# Patient Record
Sex: Male | Born: 1960
Health system: Southern US, Community
[De-identification: ages and names within clinical notes are randomized; demographics above are authoritative.]

## PROBLEM LIST (undated history)

## (undated) DIAGNOSIS — I10 Essential (primary) hypertension: Secondary | ICD-10-CM

## (undated) DIAGNOSIS — J069 Acute upper respiratory infection, unspecified: Secondary | ICD-10-CM

## (undated) DIAGNOSIS — I2510 Atherosclerotic heart disease of native coronary artery without angina pectoris: Secondary | ICD-10-CM

## (undated) DIAGNOSIS — J302 Other seasonal allergic rhinitis: Secondary | ICD-10-CM

## (undated) DIAGNOSIS — E785 Hyperlipidemia, unspecified: Secondary | ICD-10-CM

## (undated) HISTORY — DX: Essential (primary) hypertension: I10

## (undated) HISTORY — PX: LAPAROSCOPIC CHOLECYSTECTOMY: SUR755

## (undated) HISTORY — DX: Atherosclerotic heart disease of native coronary artery without angina pectoris: I25.10

## (undated) HISTORY — DX: Hyperlipidemia, unspecified: E78.5

## (undated) HISTORY — PX: APPENDECTOMY: SHX54

---

## 2009-10-01 ENCOUNTER — Encounter: Admission: RE | Admit: 2009-10-01 | Discharge: 2009-10-01 | Payer: Self-pay | Admitting: Family Medicine

## 2009-10-14 HISTORY — PX: CORONARY ANGIOPLASTY WITH STENT PLACEMENT: SHX49

## 2009-11-09 ENCOUNTER — Ambulatory Visit: Payer: Self-pay | Admitting: Cardiology

## 2009-11-09 ENCOUNTER — Inpatient Hospital Stay (HOSPITAL_COMMUNITY): Admission: EM | Admit: 2009-11-09 | Discharge: 2009-11-12 | Payer: Self-pay | Admitting: Emergency Medicine

## 2009-11-17 ENCOUNTER — Telehealth: Payer: Self-pay | Admitting: Cardiology

## 2009-11-27 DIAGNOSIS — E785 Hyperlipidemia, unspecified: Secondary | ICD-10-CM | POA: Insufficient documentation

## 2009-11-27 DIAGNOSIS — I10 Essential (primary) hypertension: Secondary | ICD-10-CM | POA: Insufficient documentation

## 2009-11-27 DIAGNOSIS — E119 Type 2 diabetes mellitus without complications: Secondary | ICD-10-CM | POA: Insufficient documentation

## 2009-12-01 ENCOUNTER — Ambulatory Visit: Payer: Self-pay | Admitting: Cardiology

## 2009-12-01 ENCOUNTER — Encounter (INDEPENDENT_AMBULATORY_CARE_PROVIDER_SITE_OTHER): Payer: Self-pay | Admitting: *Deleted

## 2009-12-01 DIAGNOSIS — I2589 Other forms of chronic ischemic heart disease: Secondary | ICD-10-CM | POA: Insufficient documentation

## 2009-12-01 DIAGNOSIS — I2510 Atherosclerotic heart disease of native coronary artery without angina pectoris: Secondary | ICD-10-CM | POA: Insufficient documentation

## 2009-12-18 ENCOUNTER — Encounter: Payer: Self-pay | Admitting: Cardiology

## 2009-12-19 ENCOUNTER — Encounter: Payer: Self-pay | Admitting: Cardiology

## 2009-12-19 ENCOUNTER — Ambulatory Visit: Payer: Self-pay | Admitting: Cardiology

## 2009-12-19 ENCOUNTER — Ambulatory Visit: Payer: Self-pay

## 2009-12-19 ENCOUNTER — Ambulatory Visit (HOSPITAL_COMMUNITY): Admission: RE | Admit: 2009-12-19 | Discharge: 2009-12-19 | Payer: Self-pay | Admitting: Cardiology

## 2009-12-24 ENCOUNTER — Telehealth: Payer: Self-pay | Admitting: Cardiology

## 2009-12-24 ENCOUNTER — Telehealth (INDEPENDENT_AMBULATORY_CARE_PROVIDER_SITE_OTHER): Payer: Self-pay | Admitting: *Deleted

## 2009-12-24 LAB — CONVERTED CEMR LAB
Alkaline Phosphatase: 37 units/L — ABNORMAL LOW (ref 39–117)
Basophils Absolute: 0 10*3/uL (ref 0.0–0.1)
Bilirubin, Direct: 0 mg/dL (ref 0.0–0.3)
Eosinophils Absolute: 0.1 10*3/uL (ref 0.0–0.7)
HCT: 39.7 % (ref 39.0–52.0)
Hemoglobin: 13.4 g/dL (ref 13.0–17.0)
LDL Cholesterol: 51 mg/dL (ref 0–99)
Lymphs Abs: 1.5 10*3/uL (ref 0.7–4.0)
MCHC: 33.8 g/dL (ref 30.0–36.0)
Monocytes Relative: 9.2 % (ref 3.0–12.0)
Neutro Abs: 2.4 10*3/uL (ref 1.4–7.7)
RDW: 14 % (ref 11.5–14.6)
Total CHOL/HDL Ratio: 3

## 2010-01-05 ENCOUNTER — Encounter: Payer: Self-pay | Admitting: Cardiology

## 2010-01-06 ENCOUNTER — Ambulatory Visit: Payer: Self-pay | Admitting: Cardiology

## 2010-02-05 ENCOUNTER — Telehealth: Payer: Self-pay | Admitting: Cardiology

## 2010-05-17 ENCOUNTER — Emergency Department (HOSPITAL_COMMUNITY): Admission: EM | Admit: 2010-05-17 | Discharge: 2010-05-17 | Payer: Self-pay | Admitting: Emergency Medicine

## 2010-06-01 ENCOUNTER — Ambulatory Visit: Payer: Self-pay | Admitting: Cardiology

## 2010-09-15 NOTE — Assessment & Plan Note (Signed)
Summary: EPH   Visit Type:  Follow-up Primary Provider:  DR [name]  CC:  no complaints.  History of Present Illness: The patient is 50 years old and returns for management of CAD after his recent anterior MI. He was hospitalized on November 09, 2009 with an anterior wall MI treated with a bare-metal stent to the LAD. His ejection fraction at catheterization was 40%. This was a little surprising since his reperfusion was lately been having chest pain for a few days.  He's done well since discharge with no chest pain shortness of breath or palpitations. He says his walking about 45 minutes a day and is scheduled to return to work later this week. He works at [employer] as a Nature conservation officer.  His other problems include hypertension, hyperlipidemia, and diet-controlled diabetes.  Current Medications (verified): 1)  Aspirin Ec 325 Mg Tbec (Aspirin) .... Take One Tablet By Mouth Daily 2)  Coreg 12.5 Mg Tabs (Carvedilol) .Marland Kitchen.. 1 Tab Bid 3)  Nitrostat 0.4 Mg Subl (Nitroglycerin) .Marland Kitchen.. 1 Tablet Under Tongue At Onset of Chest Pain; You May Repeat Every 5 Minutes For Up To 3 Doses. 4)  Effient 10 Mg Tabs (Prasugrel Hcl) .Marland Kitchen.. 1 Tab Once Daily 5)  Crestor 20 Mg Tabs (Rosuvastatin Calcium) .Marland Kitchen.. 1 Tab Once Daily 6)  Fish Oil .... 2 Tabs in The Am 1 Tab in The Pm 7)  Flonase 50 Mcg/act Susp (Fluticasone Propionate) .Marland Kitchen.. 1 Spray in Each Nostril As Needed 8)  Lisinopril-Hydrochlorothiazide 20-12.5 Mg Tabs (Lisinopril-Hydrochlorothiazide) .Marland Kitchen.. 1 Tab Qd 9)  Tricor 145 Mg Tabs (Fenofibrate) .Marland Kitchen.. 1 Tab Once Daily  Allergies (verified): No Known Drug Allergies  Past History:  Past Medical History: Reviewed history from 11/27/2009 and no changes required. Diabetes Controlled Hyperlipidemia Hypertension  Current Problems:  DIABETES MELLITUS, CONTROLLED (ICD-250.00) HYPERTENSION (ICD-401.9) HYPERLIPIDEMIA (ICD-272.4)  Review of Systems       ROS is negative except as outlined in HPI.   Vital  Signs:  Patient profile:   50 year old male Height:      70 inches Weight:      281 pounds BMI:     40.47 Pulse rate:   74 / minute BP sitting:   113 / 76  (left arm) Cuff size:   large  Vitals Entered By: Burnett Kanaris, CNA (December 01, 2009 11:53 AM)  Physical Exam  Additional Exam:  Gen. Well-nourished, in no distress   Neck: No JVD, thyroid not enlarged, no carotid bruits Lungs: No tachypnea, clear without rales, rhonchi or wheezes Cardiovascular: Rhythm regular, PMI not displaced,  heart sounds  normal, no murmurs or gallops, no peripheral edema, pulses normal in all 4 extremities. Abdomen: BS normal, abdomen soft and non-tender without masses or organomegaly, no hepatosplenomegaly. MS: No deformities, no cyanosis or clubbing   Neuro:  No focal sns   Skin:  no lesions    Impression & Recommendations:  Problem # 1:  CAD, NATIVE VESSEL (ICD-414.01)  He is now 3 weeks status post an anterior wall MI treated with a bare-metal stent to the LAD. His ejection fraction is 40%. He does not have significant residual disease in the circumflex and right coronary arteries. He has done well and has had no recurrent chest pain. This problem appears stable. His updated medication list for this problem includes:    Aspirin Ec 325 Mg Tbec (Aspirin) .Marland Kitchen... Take one tablet by mouth daily    Coreg 12.5 Mg Tabs (Carvedilol) .Marland Kitchen... Take one and 1/2 tabs two times  a day    Nitrostat 0.4 Mg Subl (Nitroglycerin) .Marland Kitchen... 1 tablet under tongue at onset of chest pain; you may repeat every 5 minutes for up to 3 doses.    Effient 10 Mg Tabs (Prasugrel hcl) .Marland Kitchen... 1 tab once daily    Lisinopril-hydrochlorothiazide 20-12.5 Mg Tabs (Lisinopril-hydrochlorothiazide) .Marland Kitchen... 1 tab qd  Problem # 2:  CARDIOMYOPATHY, ISCHEMIC (ICD-414.8)  He has an ischemic cardiomyopathy with an ejection fraction of 40%. We will increase his Coreg from 12.5 b.i.d. to one and a half tablets b.i.d. We will arrange a followup echocardiogram  in 4 weeks at the time of his followup office visit. His updated medication list for this problem includes:    Aspirin Ec 325 Mg Tbec (Aspirin) .Marland Kitchen... Take one tablet by mouth daily    Coreg 12.5 Mg Tabs (Carvedilol) .Marland Kitchen... Take one and 1/2 tabs two times a day    Nitrostat 0.4 Mg Subl (Nitroglycerin) .Marland Kitchen... 1 tablet under tongue at onset of chest pain; you may repeat every 5 minutes for up to 3 doses.    Effient 10 Mg Tabs (Prasugrel hcl) .Marland Kitchen... 1 tab once daily    Lisinopril-hydrochlorothiazide 20-12.5 Mg Tabs (Lisinopril-hydrochlorothiazide) .Marland Kitchen... 1 tab qd  Problem # 3:  HYPERLIPIDEMIA (ICD-272.4)  Crestor was added to TriCor when he was in the hospital. We will get a lipid and liver profile in 4 weeks on his return office visit. His updated medication list for this problem includes:    Crestor 20 Mg Tabs (Rosuvastatin calcium) .Marland Kitchen... 1 tab once daily    Tricor 145 Mg Tabs (Fenofibrate) .Marland Kitchen... 1 tab once daily  Problem # 4:  HYPERTENSION (ICD-401.9)  This appears well controlled on current medications. His updated medication list for this problem includes:    Aspirin Ec 325 Mg Tbec (Aspirin) .Marland Kitchen... Take one tablet by mouth daily    Coreg 12.5 Mg Tabs (Carvedilol) .Marland Kitchen... Take one and 1/2 tabs two times a day    Lisinopril-hydrochlorothiazide 20-12.5 Mg Tabs (Lisinopril-hydrochlorothiazide) .Marland Kitchen... 1 tab qd  Other Orders: EKG w/ Interpretation (93000) Echocardiogram (Echo)  Patient Instructions: 1)  Your physician recommends that you schedule a follow-up appointment in: 4 weeks 2)  Your physician recommends that you return for FASTING lab work in 4 weeks: lipid/liver/cbc (410.02;272.2;402.10) 3)  Your physician has recommended you make the following change in your medication: 1) Increase coreg (carvediolol) to 12.5mg  one and 1/2 tablets two times a day  4)  Your physician has requested that you have an echocardiogram.  Echocardiography is a painless test that uses sound waves to create images  of your heart. It provides your doctor with information about the size and shape of your heart and how well your heart's chambers and valves are working.  This procedure takes approximately one hour. There are no restrictions for this procedure. Prescriptions: COREG 12.5 MG TABS (CARVEDILOL) take one and 1/2 tabs two times a day  #90 x 6   Entered by:   Sherri Rad, RN, BSN   Authorized by:   Lenoria Farrier, MD, [facility]   Signed by:   Sherri Rad, RN, BSN on 12/01/2009   Method used:   Electronically to        [facility] [phone]* (retail)       [address]       [place], [place]  [address]       Ph: [phone] or [phone]       Fax:  [phone]   RxID:   [id number]

## 2010-09-15 NOTE — Assessment & Plan Note (Signed)
Summary: 4 WKS   Visit Type:  Follow-up Primary Provider:  DR [name]  CC:  no complaints.  History of Present Illness: The patient is 50 years old and return for management of CAD. On November 09, 2009 he had an anterior MI treated with a bare-metal stent to the LAD. His ejection fraction was 40% but on his recent echo on 12/19/09 his ejection fraction had improved to 55 and 60%.  He has been doing quite well over the past several weeks. 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 He also is walking 30 minutes a day. He is lost about 5 pounds since his last visit and is on a program of gradual weight reduction.  His other problems include hypertension, hyperlipidemia, and diet-controlled diabetes. We recently switched him from Crestor to simvastatin for cost reasons. His lipid profile on Crestor was a total cholesterol 104, and HDL of 31, and LDL of 51, and a triglyceride of 107.  Current Medications (verified): 1)  Aspirin 81 Mg Tbec (Aspirin) .... Take One Tablet By Mouth Daily 2)  Coreg 12.5 Mg Tabs (Carvedilol) .... Take One and 1/2 Tabs Two Times A Day 3)  Nitrostat 0.4 Mg Subl (Nitroglycerin) .Marland Kitchen.. 1 Tablet Under Tongue At Onset of Chest Pain; You May Repeat Every 5 Minutes For Up To 3 Doses. 4)  Effient 10 Mg Tabs (Prasugrel Hcl) .Marland Kitchen.. 1 Tab Once Daily 5)  Simvastatin 40 Mg Tabs (Simvastatin) .... Take One Tablet By Mouth Daily At Bedtime 6)  Fish Oil .... 2 Tabs in The Am 1 Tab in The Pm 7)  Flonase 50 Mcg/act Susp (Fluticasone Propionate) .Marland Kitchen.. 1 Spray in Each Nostril As Needed 8)  Lisinopril-Hydrochlorothiazide 20-12.5 Mg Tabs (Lisinopril-Hydrochlorothiazide) .Marland Kitchen.. 1 Tab Qd 9)  Tricor 145 Mg Tabs (Fenofibrate) .Marland Kitchen.. 1 Tab Once Daily  Allergies (verified): No Known Drug Allergies  Past History:  Past Medical History: Reviewed history from 11/27/2009 and no changes required. Diabetes Controlled Hyperlipidemia Hypertension  Current Problems:  DIABETES MELLITUS,  CONTROLLED (ICD-250.00) HYPERTENSION (ICD-401.9) HYPERLIPIDEMIA (ICD-272.4)  Review of Systems       ROS is negative except as outlined in HPI.   Vital Signs:  Patient profile:   50 year old male Height:      70 inches Weight:      277 pounds Pulse rate:   60 / minute BP sitting:   113 / 67  (left arm)  Vitals Entered By: Burnett Kanaris, CNA (Jan 06, 2010 9:58 AM)  Physical Exam  Additional Exam:  Gen. Well-nourished, in no distress   Neck: No JVD, thyroid not enlarged, no carotid bruits Lungs: No tachypnea, clear without rales, rhonchi or wheezes Cardiovascular: Rhythm regular, PMI not displaced,  heart sounds  normal, no murmurs or gallops, no peripheral edema, pulses normal in all 4 extremities. Abdomen: BS normal, abdomen soft and non-tender without masses or organomegaly, no hepatosplenomegaly. MS: No deformities, no cyanosis or clubbing   Neuro:  No focal sns   Skin:  no lesions    Impression & Recommendations:  Problem # 1:  CAD, NATIVE VESSEL (ICD-414.01) He is now about 2 months status post a bare-metal stent to the LAD for an anterior MI. His ejection fraction has normalized. He is doing quite well and this problem is stable. His updated medication list for this problem includes:    Aspirin 81 Mg Tbec (Aspirin) .Marland Kitchen... Take one tablet by mouth daily    Coreg 12.5 Mg Tabs (Carvedilol) .Marland KitchenMarland KitchenMarland KitchenMarland Kitchen  Take one and 1/2 tabs two times a day    Nitrostat 0.4 Mg Subl (Nitroglycerin) .Marland Kitchen... 1 tablet under tongue at onset of chest pain; you may repeat every 5 minutes for up to 3 doses.    Effient 10 Mg Tabs (Prasugrel hcl) .Marland Kitchen... 1 tab once daily    Lisinopril-hydrochlorothiazide 20-12.5 Mg Tabs (Lisinopril-hydrochlorothiazide) .Marland Kitchen... 1 tab qd  Problem # 2:  HYPERLIPIDEMIA (ICD-272.4) His lipid profile on Crestor was good except for a low HDL of 31. We have now switched and the simvastatin for cost reasons. His triglycerides have been low and I think he can get by without the fetal  fibroid. We will stop the TriCor and continue the simvastatin he is scheduled for a followup lipid profile in 6 weeks. We also talked about low back carbohydrate diet to help with the low HDL and he is already doing this along with his diabetic and weight reduction diet. The following medications were removed from the medication list:    Tricor 145 Mg Tabs (Fenofibrate) .Marland Kitchen... 1 tab once daily His updated medication list for this problem includes:    Simvastatin 40 Mg Tabs (Simvastatin) .Marland Kitchen... Take one tablet by mouth daily at bedtime  Problem # 3:  HYPERTENSION (ICD-401.9)  This appears well controlled on current medications. His updated medication list for this problem includes:    Aspirin 81 Mg Tbec (Aspirin) .Marland Kitchen... Take one tablet by mouth daily    Coreg 12.5 Mg Tabs (Carvedilol) .Marland Kitchen... Take one and 1/2 tabs two times a day    Lisinopril-hydrochlorothiazide 20-12.5 Mg Tabs (Lisinopril-hydrochlorothiazide) .Marland Kitchen... 1 tab qd  His updated medication list for this problem includes:    Aspirin 81 Mg Tbec (Aspirin) .Marland Kitchen... Take one tablet by mouth daily    Coreg 12.5 Mg Tabs (Carvedilol) .Marland Kitchen... Take one and 1/2 tabs two times a day    Lisinopril-hydrochlorothiazide 20-12.5 Mg Tabs (Lisinopril-hydrochlorothiazide) .Marland Kitchen... 1 tab qd  Patient Instructions: 1)  Your physician has recommended you make the following change in your medication: 1) STOP Tricor. 2)  Your physician wants you to follow-up in: 6 months with Dr. Juanda Chance.  You will receive a reminder letter in the mail two months in advance. If you don't receive a letter, please call our office to schedule the follow-up appointment. 3)  FASTING labwork as scheduled around 02/17/10.

## 2010-09-15 NOTE — Progress Notes (Signed)
Summary: refill meds  Phone Note Refill Request Call back at Home Phone [phone] Message from:  Patient on Dec 24, 2009 12:04 PM  Refills Requested: Medication #1:  CRESTOR 20 MG TABS 1 tab once daily [facility] in [place]   Method Requested: Fax to Local Pharmacy Initial call taken by: Lorne Skeens,  Dec 24, 2009 12:04 PM  Follow-up for Phone Call        Rx faxed to pharmacy Follow-up by: Oswald Hillock,  Dec 24, 2009 2:14 PM    Prescriptions: CRESTOR 20 MG TABS (ROSUVASTATIN CALCIUM) 1 tab once daily  #30 x 6   Entered by:   Oswald Hillock   Authorized by:   Lenoria Farrier, MD, [facility]   Signed by:   Oswald Hillock on 12/24/2009   Method used:   Faxed to ...       [facility] [phone]* (retail)       [address]       [place], [place]  [address]       Ph: [phone] or [phone]       Fax: [phone]   RxID:   [id number]

## 2010-09-15 NOTE — Progress Notes (Signed)
Summary: Question about medication -Crestor   Phone Note Call from Patient Call back at Home Phone [phone]   Caller: Patient Summary of Call: Pt have questions about medication ( Rosubastatin 20mg ) Initial call taken by: Judie Grieve,  November 17, 2009 9:33 AM  Follow-up for Phone Call        left message for pt to return phone call@ 10:53am  Follow-up by: Burnett Kanaris, CNA,  November 17, 2009 10:54 AM  Additional Follow-up for Phone Call Additional follow up Details #1::        PT RETURNED CALL AND LET ME KNOW HE HAD NOT STARTED CRESTOR YET BECAUSE PHARMACY STATED THAT INSURANCE WOULD NOT COVER CRESTOR. I CALLED [facility] ON [address] IN [place].PHARAMIST STATED THAT INSURANCE COMPANY WOULD LIKE PT TO TRY A GENERIC BRAND LIKE SIMVASTATION FIRST-IF NOT PRIOR AUTHORZATION WOULD BE NEEDED. i GAVE PT SAMPLES OF CRESTOR TO HOLD HIM UNTIL HE COMES IN FOR HIS APPT IN [place] Additional Follow-up by: Burnett Kanaris, CNA,  November 17, 2009 11:57 AM    Additional Follow-up for Phone Call Additional follow up Details #2::    Noted- we will address with the pt at his office visit. Follow-up by: Sherri Rad, RN, BSN,  November 19, 2009 9:18 AM  .

## 2010-09-15 NOTE — Assessment & Plan Note (Signed)
Summary: eph   Visit Type:  Follow-up Primary Keeyon Privitera:  DR [name]  CC:  no complaints.  History of Present Illness: Mr. Scott Stokes is 50 years old and return for management of CAD. In March of 2011 he had anterior wall MI treated with a bare-metal stent to the LAD. His ejection fraction was 40% and improved to 60%.  He has done well since that time.  he [facility] for some upper expiratory symptoms and had some vague chest pain but this was thought to be musculoskeletal. All his studies that were negative. He's had no recurrence of symptoms and has had no shortness of breath or palpitations.    He had some potential side effects of simvastatin and is now on Crestor which is being provided by his primary care physician.  His other problems include hypertension and Diovan his diabetes.  Current Medications (verified): 1)  Aspirin 81 Mg Tbec (Aspirin) .... Take One Tablet By Mouth Daily 2)  Coreg 12.5 Mg Tabs (Carvedilol) .... Take One and 1/2 Tabs Two Times A Day 3)  Nitrostat 0.4 Mg Subl (Nitroglycerin) .Marland Kitchen.. 1 Tablet Under Tongue At Onset of Chest Pain; You May Repeat Every 5 Minutes For Up To 3 Doses. 4)  Effient 10 Mg Tabs (Prasugrel Hcl) .Marland Kitchen.. 1 Tab Once Daily 5)  Crestor 10 Mg Tabs (Rosuvastatin Calcium) .... Take One Tablet By Mouth Daily. 6)  Fish Oil .... 2 Tabs in The Am 1 Tab in The Pm 7)  Flonase 50 Mcg/act Susp (Fluticasone Propionate) .Marland Kitchen.. 1 Spray in Each Nostril As Needed 8)  Lisinopril-Hydrochlorothiazide 20-12.5 Mg Tabs (Lisinopril-Hydrochlorothiazide) .Marland Kitchen.. 1 Tab Qd 9)  Ranitidine Hcl 150 Mg Tabs (Ranitidine Hcl) .... Up Two  Tab S Daily For Indigestion  Allergies (verified): No Known Drug Allergies  Past History:  Past Medical History: Reviewed history from 11/27/2009 and no changes required. Diabetes Controlled Hyperlipidemia Hypertension  Current Problems:  DIABETES MELLITUS, CONTROLLED (ICD-250.00) HYPERTENSION  (ICD-401.9) HYPERLIPIDEMIA (ICD-272.4)  Review of Systems       ROS is negative except as outlined in HPI.   Vital Signs:  Patient profile:   50 year old male Height:      70 inches Weight:      263 pounds BMI:     37.87 Pulse rate:   55 / minute BP sitting:   133 / 80  (left arm) Cuff size:   large  Vitals Entered By: Burnett Kanaris, CNA (June 01, 2010 9:43 AM)  Physical Exam  Additional Exam:  Gen. Well-nourished, in no distress   Neck: No JVD, thyroid not enlarged, no carotid bruits Lungs: No tachypnea, clear without rales, rhonchi or wheezes Cardiovascular: Rhythm regular, PMI not displaced,  heart sounds  normal, no murmurs or gallops, no peripheral edema, pulses normal in all 4 extremities. Abdomen: BS normal, abdomen soft and non-tender without masses or organomegaly, no hepatosplenomegaly. MS: No deformities, no cyanosis or clubbing   Neuro:  No focal sns   Skin:  no lesions    Impression & Recommendations:  Problem # 1:  CAD, NATIVE VESSEL (ICD-414.01) He had an anterior MI in March of 2011 treated with a bare-metal stent to the LAD. He's had no recent chest pain this problem appears stable.  He has a bare metal stent and probably can come off the Effient at one year. His updated medication list for this problem includes:    Aspirin 81 Mg Tbec (Aspirin) .Marland Kitchen... Take one tablet by  mouth daily    Coreg 12.5 Mg Tabs (Carvedilol) .Marland Kitchen... Take one and 1/2 tabs two times a day    Nitrostat 0.4 Mg Subl (Nitroglycerin) .Marland Kitchen... 1 tablet under tongue at onset of chest pain; you may repeat every 5 minutes for up to 3 doses.    Effient 10 Mg Tabs (Prasugrel hcl) .Marland Kitchen... 1 tab once daily    Lisinopril-hydrochlorothiazide 20-12.5 Mg Tabs (Lisinopril-hydrochlorothiazide) .Marland Kitchen... 1 tab qd  Orders: EKG w/ Interpretation (93000)  Problem # 2:  CARDIOMYOPATHY, ISCHEMIC (ICD-414.8) His initial ejection fraction was 40% but this improved to 60%. This appears stable. If his blood pressure  does not require lisinopril this may be discontinued in the year His updated medication list for this problem includes:    Aspirin 81 Mg Tbec (Aspirin) .Marland Kitchen... Take one tablet by mouth daily    Coreg 12.5 Mg Tabs (Carvedilol) .Marland Kitchen... Take one and 1/2 tabs two times a day    Nitrostat 0.4 Mg Subl (Nitroglycerin) .Marland Kitchen... 1 tablet under tongue at onset of chest pain; you may repeat every 5 minutes for up to 3 doses.    Effient 10 Mg Tabs (Prasugrel hcl) .Marland Kitchen... 1 tab once daily    Lisinopril-hydrochlorothiazide 20-12.5 Mg Tabs (Lisinopril-hydrochlorothiazide) .Marland Kitchen... 1 tab qd  Problem # 3:  HYPERTENSION (ICD-401.9) This is well controlled on current medications. His updated medication list for this problem includes:    Aspirin 81 Mg Tbec (Aspirin) .Marland Kitchen... Take one tablet by mouth daily    Coreg 12.5 Mg Tabs (Carvedilol) .Marland Kitchen... Take one and 1/2 tabs two times a day    Lisinopril-hydrochlorothiazide 20-12.5 Mg Tabs (Lisinopril-hydrochlorothiazide) .Marland Kitchen... 1 tab qd  Problem # 4:  HYPERLIPIDEMIA (ICD-272.4) He had side effects from simvastatin and is now taking Crestor. This is managed by his primary care physician. His updated medication list for this problem includes:    Crestor 10 Mg Tabs (Rosuvastatin calcium) .Marland Kitchen... Take one tablet by mouth daily.  Patient Instructions: 1)  Your physician recommends that you schedule a follow-up appointment in: March 2012 with Dr. [name]. 2)  Your physician recommends that you continue on your current medications as directed. Please refer to the Current Medication list given to you today.

## 2010-09-15 NOTE — Miscellaneous (Signed)
Summary: [facility] Cardiac Progress Note   [facility] Progress Note   Imported By: Roderic Ovens 01/21/2010 14:38:20  _____________________________________________________________________  External Attachment:    Type:   Image     Comment:   External Document

## 2010-09-15 NOTE — Miscellaneous (Signed)
Summary: added echo 05.06.2011  Clinical Lists Changes  Observations: Added new observation of ECHOINTERP: Study Conclusions            - Left ventricle: The cavity size was mildly dilated. Wall thickness       was normal. Systolic function was normal. The estimated ejection       fraction was in the range of 55% to 60%. Wall motion was normal;       there were no regional wall motion abnormalities. Left ventricular       diastolic function parameters were normal.     - Mitral valve: Mild regurgitation.     - Left atrium: The atrium was moderately dilated.        (12/19/2009 11:58)      Echocardiogram  Procedure date:  12/19/2009  Findings:      Study Conclusions            - Left ventricle: The cavity size was mildly dilated. Wall thickness       was normal. Systolic function was normal. The estimated ejection       fraction was in the range of 55% to 60%. Wall motion was normal;       there were no regional wall motion abnormalities. Left ventricular       diastolic function parameters were normal.     - Mitral valve: Mild regurgitation.     - Left atrium: The atrium was moderately dilated.

## 2010-09-15 NOTE — Miscellaneous (Signed)
Summary: [facility] Physician Order/Treatment Plan   [facility] Physician Order/Treatment Plan   Imported By: Roderic Ovens 12/10/2009 14:35:26  _____________________________________________________________________  External Attachment:    Type:   Image     Comment:   External Document

## 2010-09-15 NOTE — Letter (Signed)
Summary: Return To Work  [employer], Main Office  [address]   [place], [place] [address]   Phone: [phone]  Fax: [phone]    12/01/2009  TO: Leodis Sias IT MAY CONCERN   RE: Scott Stokes [address] [id number]   The above named individual is under my medical care and may return to work on: Wed. 12/03/09. He should not lift more than 25 pounds for the next two weeks.  If you have any further questions or need additional information, please call.     Sincerely,   Dr. Royston Cowper, RN, BSN

## 2010-09-15 NOTE — Progress Notes (Signed)
Summary: pt not feeling well  Phone Note Call from Patient Call back at Home Phone [phone]   Caller: Patient Reason for Call: Talk to Nurse, Talk to Doctor Summary of Call: pt is not feeling well he has flu like symptoms and he was wondering if the new cholesterol meds would do this  Initial call taken by: Omer Jack,  February 05, 2010 8:51 AM  Follow-up for Phone Call        I called and spoke with the pt. He states he has felt like he may have a stomach bug for a few days, but he is not certain. He also states that he feels like he did prior to having his gallbladder removed. He is concerned that the simvastatin may be affecting his liver and maybe that's why he feels the way he does. I explained that his symptoms are not a usual side effect of statins, but that he could hold his simvastatin for a couple of days to see if he notices any change. He states he also feels a lot of acid in his stomach after he eats. The pt does not think his ASA is coated. I advised to take a coated ASA and hold simvastatin for a couple of days. If he does not notice any change in his sypmtoms, he will restart simvastatin and f/u with his PCP. The pt is agreeable. Follow-up by: Sherri Rad, RN, BSN,  February 05, 2010 9:53 AM

## 2010-09-15 NOTE — Progress Notes (Signed)
Summary: Med review   Phone Note Outgoing Call   Call placed by: Sherri Rad, RN, BSN,  Dec 24, 2009 4:23 PM Summary of Call: I called the pt with the results of his labs and echo report. He stated his insurance is not covering his Crestor. I asked the pt if he has ever been on a different statin before. He states the only thing he has taken has been on is Tricor. I explained we may need to change him to something different b/c he has not been on another statin- insurance will probably deny even with a Prior Authorization being done. The pt also states he has a lot of bruising. I will review with Dr. Juanda Chance to see if we can decrease ASA to 81mg  once daily.  Initial call taken by: Sherri Rad, RN, BSN,  Dec 24, 2009 4:24 PM  Follow-up for Phone Call        Per Dr. Juanda Chance- ok to decrease ASA to 81mg  once daily. Since the pt has not tried anything but Crestor, Dr. Juanda Chance ordered to switch the pt to Simvastatin 40mg  once daily.  I have left a message for the pt to call. Sherri Rad, RN, BSN  Dec 25, 2009 5:04 PM   I spoke with the pt. He is aware that per Dr. Juanda Chance, he may decrease ASA to 81mg  once daily. He has been instructed to call us should he notice more increased bruising or bleeding and we may need to switch him to Plavix. He is also agreeable to switch his Crestor to Simvastatin 40mg  once daily and come for a repeat lipid and liver panel in 6 weeks after starting simvastatin. He still has about 2 weeks worth of Crestor. He will come 02/17/10 for labs. Follow-up by: Sherri Rad, RN, BSN,  Dec 26, 2009 10:02 AM  Additional Follow-up for Phone Call Additional follow up Details #1::        Len Childs to try simvistatin 40 and asa 81 mg. BB Additional Follow-up by: Lenoria Farrier, MD, [facility],  Dec 30, 2009 10:29 PM    New/Updated Medications: ASPIRIN 81 MG TBEC (ASPIRIN) Take one tablet by mouth daily SIMVASTATIN 40 MG TABS (SIMVASTATIN) Take one tablet by mouth daily at  bedtime Prescriptions: SIMVASTATIN 40 MG TABS (SIMVASTATIN) Take one tablet by mouth daily at bedtime  #30 x 6   Entered by:   Sherri Rad, RN, BSN   Authorized by:   Lenoria Farrier, MD, [facility]   Signed by:   Sherri Rad, RN, BSN on 12/26/2009   Method used:   Electronically to        [facility] [phone]* (retail)       [address]       [place], [place]  [address]       Ph: [phone] or [phone]       Fax: [phone]   RxID:   [id number]

## 2010-10-12 ENCOUNTER — Encounter: Payer: Self-pay | Admitting: Cardiovascular Disease

## 2010-10-19 ENCOUNTER — Encounter: Payer: Self-pay | Admitting: Cardiovascular Disease

## 2010-10-29 LAB — POCT I-STAT, CHEM 8
HCT: 46 % (ref 39.0–52.0)
Hemoglobin: 15.6 g/dL (ref 13.0–17.0)
Potassium: 4.1 mEq/L (ref 3.5–5.1)
Sodium: 141 mEq/L (ref 135–145)
TCO2: 29 mmol/L (ref 0–100)

## 2010-10-29 LAB — URINALYSIS, ROUTINE W REFLEX MICROSCOPIC
Bilirubin Urine: NEGATIVE
Hgb urine dipstick: NEGATIVE
Ketones, ur: NEGATIVE mg/dL
Nitrite: NEGATIVE
pH: 5.5 (ref 5.0–8.0)

## 2010-10-29 LAB — POCT CARDIAC MARKERS
CKMB, poc: 1.3 ng/mL (ref 1.0–8.0)
Myoglobin, poc: 65 ng/mL (ref 12–200)

## 2010-10-29 LAB — CBC
HCT: 43.5 % (ref 39.0–52.0)
Platelets: 187 10*3/uL (ref 150–400)
RDW: 13 % (ref 11.5–15.5)
WBC: 7.4 10*3/uL (ref 4.0–10.5)

## 2010-10-29 LAB — DIFFERENTIAL
Basophils Absolute: 0 10*3/uL (ref 0.0–0.1)
Basophils Relative: 0 % (ref 0–1)
Lymphocytes Relative: 30 % (ref 12–46)
Monocytes Absolute: 0.6 10*3/uL (ref 0.1–1.0)
Neutro Abs: 4.5 10*3/uL (ref 1.7–7.7)
Neutrophils Relative %: 60 % (ref 43–77)

## 2010-10-29 LAB — RAPID URINE DRUG SCREEN, HOSP PERFORMED
Amphetamines: NOT DETECTED
Cocaine: NOT DETECTED
Tetrahydrocannabinol: NOT DETECTED

## 2010-10-29 LAB — PROTIME-INR
INR: 1.02 (ref 0.00–1.49)
Prothrombin Time: 13.6 seconds (ref 11.6–15.2)

## 2010-11-05 ENCOUNTER — Encounter: Payer: Self-pay | Admitting: Cardiovascular Disease

## 2010-11-05 ENCOUNTER — Ambulatory Visit: Payer: Self-pay | Admitting: Cardiovascular Disease

## 2010-11-05 ENCOUNTER — Ambulatory Visit (INDEPENDENT_AMBULATORY_CARE_PROVIDER_SITE_OTHER): Payer: BC Managed Care – PPO | Admitting: Cardiovascular Disease

## 2010-11-05 VITALS — BP 130/58 | HR 68 | Wt 262.0 lb

## 2010-11-05 DIAGNOSIS — I10 Essential (primary) hypertension: Secondary | ICD-10-CM

## 2010-11-05 DIAGNOSIS — E785 Hyperlipidemia, unspecified: Secondary | ICD-10-CM

## 2010-11-05 DIAGNOSIS — I2510 Atherosclerotic heart disease of native coronary artery without angina pectoris: Secondary | ICD-10-CM

## 2010-11-05 NOTE — Assessment & Plan Note (Signed)
Good control. Followed by primary care doctor.

## 2010-11-05 NOTE — Assessment & Plan Note (Signed)
Stable No changes 

## 2010-11-05 NOTE — Progress Notes (Signed)
History of Present Illness: 50 yo WM with history of CAD, HTN, hyperlipidemia and DM who is here today for cardiac follow up. He has been followed in the past by Dr. Juanda Chance. In March of 2011 he had an anterior wall MI treated with a bare-metal stent to the LAD. His ejection fraction was 40% and improved to 60%. He has not tolerated simvastatin in the past but has tolerated Crestor. He has been doing well. No chest pain, SOB, palpitations, near syncope, dizziness, syncope, LE edema.   Recent  Cholesterol this month: Total chol: 104, LDL: 52, HDL: 37, TG: 91.    Past Medical History  Diagnosis Date  . Diabetes mellitus   . Hyperlipidemia   . Hypertension   . Coronary artery disease     Past Surgical History  Procedure Date  . Appendectomy   . Laparoscopic cholecystectomy     Current Outpatient Prescriptions  Medication Sig Dispense Refill  . aspirin 81 MG chewable tablet Chew 81 mg by mouth daily.        . carvedilol (COREG) 12.5 MG tablet Take 12.5 mg by mouth. Take 1 and 1/2 tablets by mouth two times a day       . fish oil-omega-3 fatty acids 1000 MG capsule Take 2 g by mouth daily. 2 tabs in the am and 1 tab. In the pm       . fluticasone (FLONASE) 50 MCG/ACT nasal spray 2 sprays by Nasal route daily. 1 spray each nostril as needed       . lisinopril-hydrochlorothiazide (PRINZIDE,ZESTORETIC) 20-12.5 MG per tablet Take 1 tablet by mouth daily.        . nitroGLYCERIN (NITROSTAT) 0.4 MG SL tablet Place 0.4 mg under the tongue every 5 (five) minutes as needed.        . prasugrel (EFFIENT) 10 MG TABS Take 10 mg by mouth daily.        . ranitidine (ZANTAC) 150 MG tablet Take 150 mg by mouth 2 (two) times daily. Take two tablets as needed for indigestion       . rosuvastatin (CRESTOR) 10 MG tablet Take 10 mg by mouth daily.          Allergies not on file  History   Social History  . Marital Status: Single    Spouse Name: N/A    Number of Children: N/A  . Years of Education: N/A     Occupational History  . Works at [employer].    Social History Main Topics  . Smoking status: Former Games developer  . Smokeless tobacco: Not on file  . Alcohol Use: Not on file  . Drug Use: Not on file  . Sexually Active: Not on file   Other Topics Concern  . Not on file   Social History Narrative  . No narrative on file    Family History  Problem Relation Age of Onset  . Heart disease Neg Hx     Review of Systems:  No chest pain, SOB, palpitations, dizziness,  near syncope or syncope.  No PND, orthopnea, or Lower extremity edema.   Physical Examination: BP 130/58  Pulse 68  Wt 262 lb (118.842 kg) General: Well developed, well nourished, NAD HEENT: OP clear, mucus membranes moist SKIN: warm, dry Neuro: No focal deficits Musculoskeletal: Muscle strength 5/5 all ext Psychiatric: Mood and affect normal Neck: No JVD, no carotid bruits, no thyromegaly, no lymphadenopathy. Lungs:Clear bilaterally, no wheezes, rhonci, crackles

## 2010-11-05 NOTE — Patient Instructions (Signed)
Your physician recommends that you schedule a follow-up appointment in: 6 months  

## 2010-11-05 NOTE — Assessment & Plan Note (Signed)
Stable. Continue current meds.   

## 2010-11-09 LAB — CBC
HCT: 43.7 % (ref 39.0–52.0)
HCT: 46.1 % (ref 39.0–52.0)
Hemoglobin: 14.7 g/dL (ref 13.0–17.0)
Hemoglobin: 15.9 g/dL (ref 13.0–17.0)
MCHC: 33.5 g/dL (ref 30.0–36.0)
MCHC: 34.6 g/dL (ref 30.0–36.0)
MCV: 94.7 fL (ref 78.0–100.0)
RBC: 4.94 MIL/uL (ref 4.22–5.81)
RDW: 13.7 % (ref 11.5–15.5)
RDW: 14 % (ref 11.5–15.5)

## 2010-11-09 LAB — POCT CARDIAC MARKERS
CKMB, poc: 1.4 ng/mL (ref 1.0–8.0)
Myoglobin, poc: 69.7 ng/mL (ref 12–200)
Troponin i, poc: <0.05 ng/mL (ref 0.00–0.09)

## 2010-11-09 LAB — MRSA PCR SCREENING: MRSA by PCR: NEGATIVE

## 2010-11-09 LAB — BASIC METABOLIC PANEL
CO2: 29 mEq/L (ref 19–32)
CO2: 33 mEq/L — ABNORMAL HIGH (ref 19–32)
Chloride: 102 mEq/L (ref 96–112)
GFR calc Af Amer: >60 mL/min (ref >60)
GFR calc non Af Amer: 50 mL/min — ABNORMAL LOW (ref >60)
GFR calc non Af Amer: 51 mL/min — ABNORMAL LOW (ref >60)
Glucose, Bld: 116 mg/dL — ABNORMAL HIGH (ref 70–99)
Glucose, Bld: 153 mg/dL — ABNORMAL HIGH (ref 70–99)
Potassium: 4.2 mEq/L (ref 3.5–5.1)
Potassium: 4.5 mEq/L (ref 3.5–5.1)
Sodium: 139 mEq/L (ref 135–145)
Sodium: 144 mEq/L (ref 135–145)

## 2010-11-09 LAB — APTT: aPTT: 23 seconds — ABNORMAL LOW (ref 24–37)

## 2010-11-09 LAB — LIPID PANEL
LDL Cholesterol: 113 mg/dL — ABNORMAL HIGH (ref 0–99)
VLDL: 31 mg/dL (ref 0–40)

## 2010-11-09 LAB — POCT I-STAT, CHEM 8
BUN: 42 mg/dL — ABNORMAL HIGH (ref 6–23)
Chloride: 104 mEq/L (ref 96–112)
HCT: 48 % (ref 39.0–52.0)
Potassium: 5.6 mEq/L — ABNORMAL HIGH (ref 3.5–5.1)

## 2010-11-09 LAB — HEPATIC FUNCTION PANEL
AST: 185 U/L — ABNORMAL HIGH (ref 0–37)
Bilirubin, Direct: 0.1 mg/dL (ref 0.0–0.3)
Indirect Bilirubin: 0.6 mg/dL (ref 0.3–0.9)
Total Bilirubin: 0.7 mg/dL (ref 0.3–1.2)

## 2010-11-09 LAB — CARDIAC PANEL(CRET KIN+CKTOT+MB+TROPI)
CK, MB: 118.3 ng/mL (ref 0.3–4.0)
Relative Index: 15.4 — ABNORMAL HIGH (ref 0.0–2.5)

## 2010-11-09 LAB — TROPONIN I: Troponin I: 0.03 ng/mL (ref 0.00–0.06)

## 2010-11-09 LAB — CK TOTAL AND CKMB (NOT AT ARMC)
CK, MB: 2.4 ng/mL (ref 0.3–4.0)
Total CK: 75 U/L (ref 7–232)

## 2010-11-12 ENCOUNTER — Other Ambulatory Visit: Payer: Self-pay | Admitting: *Deleted

## 2010-11-12 MED ORDER — CARVEDILOL 12.5 MG PO TABS: ORAL_TABLET | ORAL | Status: DC

## 2011-05-10 ENCOUNTER — Ambulatory Visit (INDEPENDENT_AMBULATORY_CARE_PROVIDER_SITE_OTHER): Payer: BC Managed Care – PPO | Admitting: Cardiovascular Disease

## 2011-05-10 ENCOUNTER — Encounter: Payer: Self-pay | Admitting: Cardiovascular Disease

## 2011-05-10 VITALS — BP 124/80 | HR 57 | Resp 18 | Ht 70.0 in | Wt 261.4 lb

## 2011-05-10 DIAGNOSIS — I2510 Atherosclerotic heart disease of native coronary artery without angina pectoris: Secondary | ICD-10-CM

## 2011-05-10 MED ORDER — CARVEDILOL 12.5 MG PO TABS: 12.5000 mg | ORAL_TABLET | Freq: Two times a day (BID) | ORAL | Status: DC

## 2011-05-10 NOTE — Patient Instructions (Signed)
Your physician wants you to follow-up in: 6 months.  You will receive a reminder letter in the mail two months in advance. If you don't receive a letter, please call our office to schedule the follow-up appointment  Your physician has recommended you make the following change in your medication: Decrease Coreg to 12. 5 mg by mouth twice daily.

## 2011-05-10 NOTE — Assessment & Plan Note (Signed)
Stable. Continue current meds. Will reduce Crestor to 10 mg po every other day. Will reduce Coreg to 12.5 mg po BID with bradycardia. Will continue ASA and Effient for now. Continue Ace-inhibitor.

## 2011-05-10 NOTE — Progress Notes (Signed)
History of Present Illness:50 yo WM with history of CAD, HTN, hyperlipidemia and DM who is here today for cardiac follow up. He has been followed in the past by Dr. Juanda Chance. In March of 2011 he had an anterior wall MI treated with a bare-metal stent to the LAD. His ejection fraction was 40% and improved to 60%. He has not tolerated simvastatin in the past but has tolerated Crestor. He has been doing well. No chest pain, SOB, palpitations, near syncope, dizziness, syncope, LE edema.   Recent Cholesterol March 2012: Total chol: 92, LDL: 52.   Primary care is Dr. [name] with [facility] at [place].    Past Medical History  Diagnosis Date  . Diabetes mellitus   . Hyperlipidemia   . Hypertension   . Coronary artery disease     Past Surgical History  Procedure Date  . Appendectomy   . Laparoscopic cholecystectomy     Current Outpatient Prescriptions  Medication Sig Dispense Refill  . aspirin 81 MG chewable tablet Chew 81 mg by mouth daily.        . carvedilol (COREG) 12.5 MG tablet Take 1 and 1/2 tablets by mouth two times a day  90 tablet  5  . fish oil-omega-3 fatty acids 1000 MG capsule Take 2 g by mouth daily. 2 tabs in the am and 1 tab. In the pm       . fluticasone (FLONASE) 50 MCG/ACT nasal spray 2 sprays by Nasal route daily.       Marland Kitchen lisinopril-hydrochlorothiazide (PRINZIDE,ZESTORETIC) 20-12.5 MG per tablet Take 1 tablet by mouth daily.        . nitroGLYCERIN (NITROSTAT) 0.4 MG SL tablet Place 0.4 mg under the tongue every 5 (five) minutes as needed.        . prasugrel (EFFIENT) 10 MG TABS Take 10 mg by mouth daily.        . rosuvastatin (CRESTOR) 10 MG tablet Take 10 mg by mouth daily.          Allergies  Allergen Reactions  . Simvastatin Nausea Only    History   Social History  . Marital Status: Single    Spouse Name: N/A    Number of Children: N/A  . Years of Education: N/A   Occupational History  . Works at [employer].    Social History Main Topics  . Smoking  status: Former Games developer  . Smokeless tobacco: Not on file  . Alcohol Use: Not on file  . Drug Use: Not on file  . Sexually Active: Not on file   Other Topics Concern  . Not on file   Social History Narrative  . No narrative on file    Family History  Problem Relation Age of Onset  . Heart disease Neg Hx     Review of Systems:  As stated in the HPI and otherwise negative.   BP 124/80  Pulse 57  Resp 18  Ht 5\' 10"  (1.778 m)  Wt 261 lb 6.4 oz (118.57 kg)  BMI 37.51 kg/m2  Physical Examination: General: Well developed, well nourished, NAD HEENT: OP clear, mucus membranes moist SKIN: warm, dry. No rashes. Neuro: No focal deficits Musculoskeletal: Muscle strength 5/5 all ext Psychiatric: Mood and affect normal Neck: No JVD, no carotid bruits, no thyromegaly, no lymphadenopathy. Lungs:Clear bilaterally, no wheezes, rhonci, crackles Cardiovascular: Regular rate and rhythm. No murmurs, gallops or rubs. Abdomen:Soft. Bowel sounds present. Non-tender.  Extremities: No lower extremity edema. Pulses are 2 + in the bilateral DP/PT.  ZOX:WRUEA bradycardia, rate 57 bpm.

## 2011-05-14 ENCOUNTER — Other Ambulatory Visit: Payer: Self-pay | Admitting: *Deleted

## 2011-05-14 MED ORDER — NITROGLYCERIN 0.4 MG SL SUBL: 0.4000 mg | SUBLINGUAL_TABLET | SUBLINGUAL | Status: DC | PRN

## 2011-06-18 ENCOUNTER — Telehealth: Payer: Self-pay | Admitting: Cardiovascular Disease

## 2011-06-18 NOTE — Telephone Encounter (Signed)
New message Pt called He wants refill of effient called to [facility]  Please call him when done

## 2011-06-21 ENCOUNTER — Other Ambulatory Visit: Payer: Self-pay | Admitting: *Deleted

## 2011-06-21 MED ORDER — PRASUGREL HCL 10 MG PO TABS: 10.0000 mg | ORAL_TABLET | Freq: Every day | ORAL | Status: DC

## 2011-06-22 NOTE — Telephone Encounter (Signed)
Prescription responded to by other means.  Called pharmacy, verified ready for pick up.  Called patient back and attempted to leave a voice message.  Received no answer at either home or cell.  Judithe Modest, CMA

## 2011-10-23 ENCOUNTER — Emergency Department (HOSPITAL_COMMUNITY)
Admission: EM | Admit: 2011-10-23 | Discharge: 2011-10-23 | Disposition: A | Discharge status: Home / Self Care | Payer: BC Managed Care – PPO | Attending: Emergency Medicine | Admitting: Emergency Medicine

## 2011-10-23 ENCOUNTER — Encounter (HOSPITAL_COMMUNITY): Payer: Self-pay | Admitting: Nurse Practitioner

## 2011-10-23 DIAGNOSIS — I2510 Atherosclerotic heart disease of native coronary artery without angina pectoris: Secondary | ICD-10-CM | POA: Insufficient documentation

## 2011-10-23 DIAGNOSIS — E119 Type 2 diabetes mellitus without complications: Secondary | ICD-10-CM | POA: Insufficient documentation

## 2011-10-23 DIAGNOSIS — R059 Cough, unspecified: Secondary | ICD-10-CM | POA: Insufficient documentation

## 2011-10-23 DIAGNOSIS — E785 Hyperlipidemia, unspecified: Secondary | ICD-10-CM | POA: Insufficient documentation

## 2011-10-23 DIAGNOSIS — Z202 Contact with and (suspected) exposure to infections with a predominantly sexual mode of transmission: Secondary | ICD-10-CM | POA: Insufficient documentation

## 2011-10-23 DIAGNOSIS — J3489 Other specified disorders of nose and nasal sinuses: Secondary | ICD-10-CM | POA: Insufficient documentation

## 2011-10-23 DIAGNOSIS — Z7982 Long term (current) use of aspirin: Secondary | ICD-10-CM | POA: Insufficient documentation

## 2011-10-23 DIAGNOSIS — I10 Essential (primary) hypertension: Secondary | ICD-10-CM | POA: Insufficient documentation

## 2011-10-23 DIAGNOSIS — Z79899 Other long term (current) drug therapy: Secondary | ICD-10-CM | POA: Insufficient documentation

## 2011-10-23 DIAGNOSIS — R05 Cough: Secondary | ICD-10-CM | POA: Insufficient documentation

## 2011-10-23 DIAGNOSIS — J069 Acute upper respiratory infection, unspecified: Secondary | ICD-10-CM | POA: Insufficient documentation

## 2011-10-23 HISTORY — DX: Acute upper respiratory infection, unspecified: J06.9

## 2011-10-23 MED ORDER — AZITHROMYCIN 250 MG PO TABS
1000.0000 mg | ORAL_TABLET | Freq: Once | ORAL | Status: AC
  Administered 2011-10-23: 1000 mg via ORAL
  Filled 2011-10-23: qty 4

## 2011-10-23 MED ORDER — SULFAMETHOXAZOLE-TRIMETHOPRIM 800-160 MG PO TABS: 1.0000 | ORAL_TABLET | Freq: Two times a day (BID) | ORAL | Status: AC

## 2011-10-23 MED ORDER — CEFTRIAXONE SODIUM 250 MG IJ SOLR
250.0000 mg | Freq: Once | INTRAMUSCULAR | Status: AC
  Administered 2011-10-23: 250 mg via INTRAMUSCULAR
  Filled 2011-10-23: qty 250

## 2011-10-23 MED ORDER — LIDOCAINE HCL (PF) 1 % IJ SOLN
INTRAMUSCULAR | Status: AC
  Administered 2011-10-23: 15:00:00
  Filled 2011-10-23: qty 5

## 2011-10-23 NOTE — ED Notes (Addendum)
C/o URI and finished full course of amoxicillin that relieved symptoms some but as soon as he was done he started feeling bad again. C/o congestion and chills. Also wants to be checked for gonorrhea, exposed to it 1 month ago, denies symptoms

## 2011-10-23 NOTE — Discharge Instructions (Signed)
You have been given antibiotics to treat your upper respiratory infection. You've been treated for gonorrhea/chlamydia here. You will get a call from the lab if this test comes back positive. If you're not improving go to your doctor's office.  RESOURCE GUIDE  Dental Problems  Patients with Medicaid: [facility] [phone]                                           [phone] [address] Phone:  [phone]                                                  Phone:  [phone]  If unable to pay or uninsured, contact:  [facility] or [facility]. to become qualified for the adult dental clinic.  Chronic Pain Problems Contact [facility]  [phone] Patients need to be referred by their primary care doctor.  Insufficient Money for Medicine Contact United Way:  call "211" or [facility] 870-543-0576.  No Primary Care Doctor Call [facility]  [phone] Other agencies that provide inexpensive medical care    [facility]  [phone]    [facility]  [phone]    [facility]  [phone]    [facility]  [phone]    [facility]  [phone]    [facility]  [phone]  Psychological Services [facility]  [phone] [facility]  [phone] [facility]   [phone] (emergency services [phone])  Substance Abuse Resources [facility]  [phone] [facility] 4402978737 [facility] [place] [phone] Floydene Flock [phone] Residential & Outpatient Substance Abuse Program  [phone]  Abuse/Neglect [facility] Child Abuse Hotline 916 154 7171 [facility] Child Abuse Hotline [phone] (After Hours)  Emergency Shelter [facility] [phone]) [phone]  Maternity Homes [facility] (301)086-5697 [facility] [phone]  MRSA Hotline #:   2545518898    [facility] Resources  [facility]     United Way                          [facility] Dept. [address] [place]                       [address]      [address] [place] [address]  [place]                                                Cristobal Goldmann Phone:  [phone]  Phone:  [phone]                 Phone:  [phone]  [facility] Mental [facility] Phone:  [phone]  [facility] Child Abuse Hotline 276-672-4327 408-495-9074 (After Hours)  Upper Respiratory Infection, Adult An upper respiratory infection (URI) is also sometimes known as the common cold. The upper respiratory tract includes the nose, sinuses, throat, trachea, and bronchi. Bronchi are the airways leading to the lungs. Most people improve within 1 week, but symptoms can last up to 2 weeks. A residual cough may last even longer.  CAUSES Many different viruses can infect the tissues lining the upper respiratory tract. The tissues become irritated and inflamed and often become very moist. Mucus production is also common. A cold is contagious. You can easily spread the virus to others by oral contact. This includes kissing, sharing a glass, coughing, or sneezing. Touching your mouth or nose and then touching a surface, which is then touched by another person, can also spread the virus. SYMPTOMS  Symptoms typically develop 1 to 3 days after you come in contact with a cold virus. Symptoms vary from person to person. They may include:  Runny nose.   Sneezing.   Nasal congestion.   Sinus irritation.   Sore throat.   Loss of voice (laryngitis).   Cough.   Fatigue.   Muscle aches.   Loss of appetite.   Headache.   Low-grade fever.  DIAGNOSIS  You might diagnose your own cold based on familiar symptoms, since most people get a cold 2 to 3 times a year. Your  caregiver can confirm this based on your exam. Most importantly, your caregiver can check that your symptoms are not due to another disease such as strep throat, sinusitis, pneumonia, asthma, or epiglottitis. Blood tests, throat tests, and X-rays are not necessary to diagnose a common cold, but they may sometimes be helpful in excluding other more serious diseases. Your caregiver will decide if any further tests are required. RISKS AND COMPLICATIONS  You may be at risk for a more severe case of the common cold if you smoke cigarettes, have chronic heart disease (such as heart failure) or lung disease (such as asthma), or if you have a weakened immune system. The very young and very old are also at risk for more serious infections. Bacterial sinusitis, middle ear infections, and bacterial pneumonia can complicate the common cold. The common cold can worsen asthma and chronic obstructive pulmonary disease (COPD). Sometimes, these complications can require emergency medical care and may be life-threatening. PREVENTION  The best way to protect against getting a cold is to practice good hygiene. Avoid oral or hand contact with people with cold symptoms. Wash your hands often if contact occurs. There is no clear evidence that vitamin C, vitamin E, echinacea, or exercise reduces the chance of developing a cold. However, it is always recommended to get plenty of rest and practice good nutrition. TREATMENT  Treatment is directed at relieving symptoms. There is no cure. Antibiotics are not effective, because the infection is caused by a virus, not by bacteria. Treatment may include:  Increased fluid intake. Sports drinks offer valuable electrolytes, sugars, and fluids.   Breathing heated mist or steam (vaporizer or shower).   Eating chicken soup or other clear broths, and maintaining good nutrition.   Getting plenty of rest.   Using gargles or lozenges for comfort.   Controlling fevers with ibuprofen or  acetaminophen as directed by your caregiver.  Increasing usage of your inhaler if you have asthma.  Zinc gel and zinc lozenges, taken in the first 24 hours of the common cold, can shorten the duration and lessen the severity of symptoms. Pain medicines may help with fever, muscle aches, and throat pain. A variety of non-prescription medicines are available to treat congestion and runny nose. Your caregiver can make recommendations and may suggest nasal or lung inhalers for other symptoms.  HOME CARE INSTRUCTIONS   Only take over-the-counter or prescription medicines for pain, discomfort, or fever as directed by your caregiver.   Use a warm mist humidifier or inhale steam from a shower to increase air moisture. This may keep secretions moist and make it easier to breathe.   Drink enough water and fluids to keep your urine clear or pale yellow.   Rest as needed.   Return to work when your temperature has returned to normal or as your caregiver advises. You may need to stay home longer to avoid infecting others. You can also use a face mask and careful hand washing to prevent spread of the virus.  SEEK MEDICAL CARE IF:   After the first few days, you feel you are getting worse rather than better.   You need your caregiver's advice about medicines to control symptoms.   You develop chills, worsening shortness of breath, or brown or red sputum. These may be signs of pneumonia.   You develop yellow or brown nasal discharge or pain in the face, especially when you bend forward. These may be signs of sinusitis.   You develop a fever, swollen neck glands, pain with swallowing, or white areas in the back of your throat. These may be signs of strep throat.  SEEK IMMEDIATE MEDICAL CARE IF:   You have a fever.   You develop severe or persistent headache, ear pain, sinus pain, or chest pain.   You develop wheezing, a prolonged cough, cough up blood, or have a change in your usual mucus (if you  have chronic lung disease).   You develop sore muscles or a stiff neck.  Document Released: 01/26/2001 Document Revised: 07/22/2011 Document Reviewed: 12/04/2010 [facility] Patient Information 2012 [place], [place].

## 2011-10-23 NOTE — ED Provider Notes (Signed)
History     CSN: [id number]  Arrival date & time 10/23/11  1226   First MD Initiated Contact with Patient 10/23/11 1429      Chief Complaint  Patient presents with  . URI  . Exposure to STD    (Consider location/radiation/quality/duration/timing/severity/associated sxs/prior treatment) HPI History obtained from the patient. He presents with URI like symptoms which present for about the past month. He states that he was recently treated with amoxicillin which he took to completion. States that he began to feel better towards the end of this course, but then gradually felt worse again afterwards. He does have a history of allergic rhinitis for which he takes Zyrtec daily, but states this is different. He has had a minimally productive cough along with nasal congestion. Denies fever, chills. Has not had any chest pain, shortness of breath, diaphoresis. Denies abdominal pain, nausea, vomiting, diarrhea.  Additionally, he states that one of his sexual partners was recently diagnosed with gonorrhea and he requests to be tested for this. He denies any any penile discharge, any lesions, any groin pain.  Past Medical History  Diagnosis Date  . Diabetes mellitus   . Hyperlipidemia   . Hypertension   . Coronary artery disease   . Upper respiratory infection     Past Surgical History  Procedure Date  . Appendectomy   . Laparoscopic cholecystectomy   . Coronary angioplasty with stent placement 10/2009    Family History  Problem Relation Age of Onset  . Heart disease Neg Hx     History  Substance Use Topics  . Smoking status: Former Games developer  . Smokeless tobacco: Not on file  . Alcohol Use: Yes     occassional      Review of Systems As indicated in HPI  Allergies  Simvastatin  Home Medications   Current Outpatient Rx  Name Route Sig Dispense Refill  . ASPIRIN EC 81 MG PO TBEC Oral Take 81 mg by mouth daily.    Marland Kitchen CARVEDILOL 12.5 MG PO TABS Oral Take 12.5 mg by mouth 2 (two)  times daily.    . OMEGA-3 FATTY ACIDS 1000 MG PO CAPS Oral Take 1-2 g by mouth daily. 2 tabs in the am and 1 tab. In the pm    . FLUTICASONE PROPIONATE 50 MCG/ACT NA SUSP Nasal 2 sprays by Nasal route daily.     Marland Kitchen LISINOPRIL-HYDROCHLOROTHIAZIDE 20-12.5 MG PO TABS Oral Take 1 tablet by mouth daily.     Marland Kitchen PRASUGREL HCL 10 MG PO TABS Oral Take 10 mg by mouth daily.    Marland Kitchen ROSUVASTATIN CALCIUM 10 MG PO TABS Oral Take 10 mg by mouth every other day.     Marland Kitchen NITROGLYCERIN 0.4 MG SL SUBL Sublingual Place 0.4 mg under the tongue every 5 (five) minutes x 3 doses as needed. For chest pain.      BP 135/70  Pulse 85  Temp(Src) 98 F (36.7 C) (Oral)  Resp 18  Ht 5\' 10"  (1.778 m)  Wt 275 lb (124.739 kg)  BMI 39.46 kg/m2  SpO2 99%  Physical Exam  Vitals reviewed. Constitutional: He appears well-developed and well-nourished. No distress.  HENT:  Head: Normocephalic and atraumatic.  Right Ear: External ear normal.  Left Ear: External ear normal.  Mouth/Throat: Oropharynx is clear and moist. No oropharyngeal exudate.       TMs nl b/l. Nontender to sinus palp.  Neck: Normal range of motion.  Cardiovascular: Normal rate, regular rhythm and normal heart sounds.  Pulmonary/Chest: Effort normal and breath sounds normal. He has no wheezes. He exhibits no tenderness.  Abdominal: Soft. There is no tenderness. There is no rebound.  Genitourinary: Testes normal and penis normal. Circumcised. No penile tenderness. No discharge found.  Neurological: He is alert.  Skin: Skin is warm and dry. He is not diaphoretic.  Psychiatric: He has a normal mood and affect.    ED Course  Procedures (including critical care time)   Labs Reviewed  GC/CHLAMYDIA PROBE AMP, GENITAL   No results found.   1. URI (upper respiratory infection)   2. Possible exposure to STD       MDM  1) URI sx, s/p course of amox - explained to pt that this is likely viral. 2) STI exposure - no discharge noted on exam - will tx  prophylactically.        Koren Sermersheim, [place] 10/24/11 1010

## 2011-10-24 NOTE — ED Provider Notes (Signed)
Medical screening examination/treatment/procedure(s) were performed by non-physician practitioner and as supervising physician I was immediately available for consultation/collaboration.  Gerhard Munch, MD 10/24/11 1212

## 2011-11-09 ENCOUNTER — Emergency Department (HOSPITAL_COMMUNITY)
Admission: EM | Admit: 2011-11-09 | Discharge: 2011-11-10 | Disposition: A | Discharge status: Home / Self Care | Payer: BC Managed Care – PPO | Attending: Emergency Medicine | Admitting: Emergency Medicine

## 2011-11-09 ENCOUNTER — Encounter (HOSPITAL_COMMUNITY): Payer: Self-pay | Admitting: Emergency Medicine

## 2011-11-09 ENCOUNTER — Emergency Department (HOSPITAL_COMMUNITY): Payer: BC Managed Care – PPO

## 2011-11-09 DIAGNOSIS — I2510 Atherosclerotic heart disease of native coronary artery without angina pectoris: Secondary | ICD-10-CM | POA: Insufficient documentation

## 2011-11-09 DIAGNOSIS — Z79899 Other long term (current) drug therapy: Secondary | ICD-10-CM | POA: Insufficient documentation

## 2011-11-09 DIAGNOSIS — R509 Fever, unspecified: Secondary | ICD-10-CM | POA: Insufficient documentation

## 2011-11-09 DIAGNOSIS — R05 Cough: Secondary | ICD-10-CM | POA: Insufficient documentation

## 2011-11-09 DIAGNOSIS — J069 Acute upper respiratory infection, unspecified: Secondary | ICD-10-CM | POA: Insufficient documentation

## 2011-11-09 DIAGNOSIS — R059 Cough, unspecified: Secondary | ICD-10-CM | POA: Insufficient documentation

## 2011-11-09 DIAGNOSIS — I10 Essential (primary) hypertension: Secondary | ICD-10-CM | POA: Insufficient documentation

## 2011-11-09 DIAGNOSIS — R0602 Shortness of breath: Secondary | ICD-10-CM | POA: Insufficient documentation

## 2011-11-09 DIAGNOSIS — E119 Type 2 diabetes mellitus without complications: Secondary | ICD-10-CM | POA: Insufficient documentation

## 2011-11-09 LAB — CBC
HCT: 42.2 % (ref 39.0–52.0)
Hemoglobin: 14.9 g/dL (ref 13.0–17.0)
MCH: 31.8 pg (ref 26.0–34.0)
MCHC: 35.3 g/dL (ref 30.0–36.0)
MCV: 90.2 fL (ref 78.0–100.0)
Platelets: 195 10*3/uL (ref 150–400)
RBC: 4.68 MIL/uL (ref 4.22–5.81)
RDW: 13 % (ref 11.5–15.5)
WBC: 6.4 10*3/uL (ref 4.0–10.5)

## 2011-11-09 LAB — DIFFERENTIAL
Basophils Absolute: 0 10*3/uL (ref 0.0–0.1)
Basophils Relative: 0 % (ref 0–1)
Eosinophils Absolute: 0.2 10*3/uL (ref 0.0–0.7)
Eosinophils Relative: 3 % (ref 0–5)
Lymphocytes Relative: 41 % (ref 12–46)
Lymphs Abs: 2.6 K/uL (ref 0.7–4.0)
Monocytes Absolute: 0.5 K/uL (ref 0.1–1.0)
Monocytes Relative: 8 % (ref 3–12)
Neutro Abs: 3 K/uL (ref 1.7–7.7)
Neutrophils Relative %: 48 % (ref 43–77)

## 2011-11-09 LAB — BASIC METABOLIC PANEL WITH GFR
BUN: 21 mg/dL (ref 6–23)
CO2: 25 meq/L (ref 19–32)
Chloride: 102 meq/L (ref 96–112)
Creatinine, Ser: 1.1 mg/dL (ref 0.50–1.35)
GFR calc Af Amer: 88 mL/min — ABNORMAL LOW (ref >90)
Glucose, Bld: 95 mg/dL (ref 70–99)
Potassium: 3.7 meq/L (ref 3.5–5.1)

## 2011-11-09 LAB — BASIC METABOLIC PANEL
Calcium: 9.4 mg/dL (ref 8.4–10.5)
GFR calc non Af Amer: 76 mL/min — ABNORMAL LOW (ref >90)
Sodium: 138 mEq/L (ref 135–145)

## 2011-11-09 NOTE — ED Provider Notes (Signed)
History     CSN: [id number]  Arrival date & time 11/09/11  2012   First MD Initiated Contact with Patient 11/09/11 2317      Chief Complaint  Patient presents with  . Fever     HPI  History provided by the patient. Patient is a 51 year old male with history of diabetes, hypertension hyperlipidemia presents with complaints of URI like symptoms for the past 5 days. Symptoms include dry cough, nasal congestion, rhinorrhea, chills and bodyaches. Patient reports having similar symptoms recently lasting one week. Symptoms began to feel much better and returned again over the last 5 days. Patient has been using Mucinex and some congestion symptoms and has provided some other. Patient has not tried any other medications. Symptoms are described as moderate. He denies any other aggravating or alleviating factors.    Past Medical History  Diagnosis Date  . Diabetes mellitus   . Hyperlipidemia   . Hypertension   . Coronary artery disease   . Upper respiratory infection     Past Surgical History  Procedure Date  . Appendectomy   . Laparoscopic cholecystectomy   . Coronary angioplasty with stent placement 10/2009    Family History  Problem Relation Age of Onset  . Heart disease Neg Hx     History  Substance Use Topics  . Smoking status: Former Games developer  . Smokeless tobacco: Not on file  . Alcohol Use: Yes     occassional      Review of Systems  Constitutional: Positive for fever and chills.  HENT: Positive for congestion. Negative for sore throat.   Respiratory: Positive for cough. Negative for shortness of breath.   Cardiovascular: Negative for chest pain.  Gastrointestinal: Negative for nausea, vomiting and diarrhea.  Musculoskeletal: Positive for myalgias.    Allergies  Simvastatin  Home Medications   Current Outpatient Rx  Name Route Sig Dispense Refill  . ASPIRIN EC 81 MG PO TBEC Oral Take 81 mg by mouth daily.    Marland Kitchen CARVEDILOL 12.5 MG PO TABS Oral Take 12.5 mg  by mouth 2 (two) times daily.    . OMEGA-3 FATTY ACIDS 1000 MG PO CAPS Oral Take 1-2 g by mouth daily. 2 tabs in the am and 1 tab. In the pm    . FLUTICASONE PROPIONATE 50 MCG/ACT NA SUSP Nasal 2 sprays by Nasal route daily.     Marland Kitchen LISINOPRIL-HYDROCHLOROTHIAZIDE 20-12.5 MG PO TABS Oral Take 1 tablet by mouth daily.     Marland Kitchen NITROGLYCERIN 0.4 MG SL SUBL Sublingual Place 0.4 mg under the tongue every 5 (five) minutes x 3 doses as needed. For chest pain.    Marland Kitchen PRASUGREL HCL 10 MG PO TABS Oral Take 10 mg by mouth daily.    Marland Kitchen ROSUVASTATIN CALCIUM 10 MG PO TABS Oral Take 10 mg by mouth every other day.       BP 120/71  Pulse 57  Temp(Src) 97.8 F (36.6 C) (Oral)  Resp 16  SpO2 98%  Physical Exam  Nursing note and vitals reviewed. Constitutional: He is oriented to person, place, and time. He appears well-developed and well-nourished. No distress.  HENT:  Head: Normocephalic and atraumatic.  Mouth/Throat: Oropharynx is clear and moist.  Neck: Normal range of motion. Neck supple.       No meningeal signs  Cardiovascular: Normal rate and regular rhythm.   Pulmonary/Chest: Effort normal and breath sounds normal. No respiratory distress. He has no wheezes. He has no rales.  Abdominal: Soft. He exhibits no  distension. There is no tenderness.  Musculoskeletal: He exhibits no edema and no tenderness.  Neurological: He is alert and oriented to person, place, and time.  Skin: Skin is warm.  Psychiatric: He has a normal mood and affect. His behavior is normal.    ED Course  Procedures   Results for orders placed during the hospital encounter of 11/09/11  CBC      Component Value Range   WBC 6.4  4.0 - 10.5 (K/uL)   RBC 4.68  4.22 - 5.81 (MIL/uL)   Hemoglobin 14.9  13.0 - 17.0 (g/dL)   HCT 45.4  09.8 - 11.9 (%)   MCV 90.2  78.0 - 100.0 (fL)   MCH 31.8  26.0 - 34.0 (pg)   MCHC 35.3  30.0 - 36.0 (g/dL)   RDW 14.7  82.9 - 56.2 (%)   Platelets 195  150 - 400 (K/uL)  DIFFERENTIAL      Component  Value Range   Neutrophils Relative 48  43 - 77 (%)   Neutro Abs 3.0  1.7 - 7.7 (K/uL)   Lymphocytes Relative 41  12 - 46 (%)   Lymphs Abs 2.6  0.7 - 4.0 (K/uL)   Monocytes Relative 8  3 - 12 (%)   Monocytes Absolute 0.5  0.1 - 1.0 (K/uL)   Eosinophils Relative 3  0 - 5 (%)   Eosinophils Absolute 0.2  0.0 - 0.7 (K/uL)   Basophils Relative 0  0 - 1 (%)   Basophils Absolute 0.0  0.0 - 0.1 (K/uL)  BASIC METABOLIC PANEL      Component Value Range   Sodium 138  135 - 145 (mEq/L)   Potassium 3.7  3.5 - 5.1 (mEq/L)   Chloride 102  96 - 112 (mEq/L)   CO2 25  19 - 32 (mEq/L)   Glucose, Bld 95  70 - 99 (mg/dL)   BUN 21  6 - 23 (mg/dL)   Creatinine, Ser 1.30  0.50 - 1.35 (mg/dL)   Calcium 9.4  8.4 - 86.5 (mg/dL)   GFR calc non Af Amer 76 (*) >90 (mL/min)   GFR calc Af Amer 88 (*) >90 (mL/min)  '    Dg Chest 2 View  11/09/2011  *RADIOLOGY REPORT*  Clinical Data: 51 year old male with fever, cough and shortness of breath.  CHEST - 2 VIEW  Comparison: 05/17/2010  Findings: The cardiomediastinal silhouette is unremarkable. There is no evidence of focal airspace disease, pulmonary edema, suspicious pulmonary nodule/mass, pleural effusion, or pneumothorax. No acute bony abnormalities are identified.  IMPRESSION: No evidence of acute cardiopulmonary disease.  Original Report Authenticated By: Rosendo Gros, M.D.     1. URI (upper respiratory infection)       MDM  11:20 PM patient seen and evaluated. Patient in no acute distress.   Patient feeling much better after breathing treatment. She without any concerning findings. Labs otherwise normal. At this time suspect bronchitis and viral process.     Angus Seller, [place] 11/11/11 [phone]

## 2011-11-09 NOTE — ED Notes (Signed)
PT. REPORTS FEVER  , CHILLS , BODY ACHES WITH DRY COUGH / NASAL CONGESTION FOR 5 DAYS.

## 2011-11-10 MED ORDER — ALBUTEROL SULFATE HFA 108 (90 BASE) MCG/ACT IN AERS
2.0000 | INHALATION_SPRAY | RESPIRATORY_TRACT | Status: DC | PRN
  Administered 2011-11-10: 2 via RESPIRATORY_TRACT
  Filled 2011-11-10: qty 6.7

## 2011-11-10 MED ORDER — HYDROCOD POLST-CHLORPHEN POLST 10-8 MG/5ML PO LQCR: 5.0000 mL | Freq: Two times a day (BID) | ORAL | Status: DC | PRN

## 2011-11-10 MED ORDER — IPRATROPIUM BROMIDE 0.02 % IN SOLN
0.5000 mg | Freq: Once | RESPIRATORY_TRACT | Status: AC
  Administered 2011-11-10: 0.5 mg via RESPIRATORY_TRACT
  Filled 2011-11-10: qty 2.5

## 2011-11-10 MED ORDER — ALBUTEROL SULFATE (5 MG/ML) 0.5% IN NEBU
5.0000 mg | INHALATION_SOLUTION | Freq: Once | RESPIRATORY_TRACT | Status: AC
  Administered 2011-11-10: 5 mg via RESPIRATORY_TRACT
  Filled 2011-11-10: qty 1

## 2011-11-10 NOTE — Discharge Instructions (Signed)
Your x-ray and lab work today has not shown any signs for concerning infection causing your symptoms. There were no signs of a pneumonia infection. This time your providers feel your symptoms are most likely caused from a viral infection. Continue to drink plenty of water and get plenty of rest to help your body fight the infection. Please followup to primary care provider for continued evaluation and treatment.    Upper Respiratory Infection, Adult An upper respiratory infection (URI) is also sometimes known as the common cold. The upper respiratory tract includes the nose, sinuses, throat, trachea, and bronchi. Bronchi are the airways leading to the lungs. Most people improve within 1 week, but symptoms can last up to 2 weeks. A residual cough may last even longer.  CAUSES Many different viruses can infect the tissues lining the upper respiratory tract. The tissues become irritated and inflamed and often become very moist. Mucus production is also common. A cold is contagious. You can easily spread the virus to others by oral contact. This includes kissing, sharing a glass, coughing, or sneezing. Touching your mouth or nose and then touching a surface, which is then touched by another person, can also spread the virus. SYMPTOMS  Symptoms typically develop 1 to 3 days after you come in contact with a cold virus. Symptoms vary from person to person. They may include:  Runny nose.   Sneezing.   Nasal congestion.   Sinus irritation.   Sore throat.   Loss of voice (laryngitis).   Cough.   Fatigue.   Muscle aches.   Loss of appetite.   Headache.   Low-grade fever.  DIAGNOSIS  You might diagnose your own cold based on familiar symptoms, since most people get a cold 2 to 3 times a year. Your caregiver can confirm this based on your exam. Most importantly, your caregiver can check that your symptoms are not due to another disease such as strep throat, sinusitis, pneumonia, asthma, or  epiglottitis. Blood tests, throat tests, and X-rays are not necessary to diagnose a common cold, but they may sometimes be helpful in excluding other more serious diseases. Your caregiver will decide if any further tests are required. RISKS AND COMPLICATIONS  You may be at risk for a more severe case of the common cold if you smoke cigarettes, have chronic heart disease (such as heart failure) or lung disease (such as asthma), or if you have a weakened immune system. The very young and very old are also at risk for more serious infections. Bacterial sinusitis, middle ear infections, and bacterial pneumonia can complicate the common cold. The common cold can worsen asthma and chronic obstructive pulmonary disease (COPD). Sometimes, these complications can require emergency medical care and may be life-threatening. PREVENTION  The best way to protect against getting a cold is to practice good hygiene. Avoid oral or hand contact with people with cold symptoms. Wash your hands often if contact occurs. There is no clear evidence that vitamin C, vitamin E, echinacea, or exercise reduces the chance of developing a cold. However, it is always recommended to get plenty of rest and practice good nutrition. TREATMENT  Treatment is directed at relieving symptoms. There is no cure. Antibiotics are not effective, because the infection is caused by a virus, not by bacteria. Treatment may include:  Increased fluid intake. Sports drinks offer valuable electrolytes, sugars, and fluids.   Breathing heated mist or steam (vaporizer or shower).   Eating chicken soup or other clear broths, and maintaining good  nutrition.   Getting plenty of rest.   Using gargles or lozenges for comfort.   Controlling fevers with ibuprofen or acetaminophen as directed by your caregiver.   Increasing usage of your inhaler if you have asthma.  Zinc gel and zinc lozenges, taken in the first 24 hours of the common cold, can shorten the  duration and lessen the severity of symptoms. Pain medicines may help with fever, muscle aches, and throat pain. A variety of non-prescription medicines are available to treat congestion and runny nose. Your caregiver can make recommendations and may suggest nasal or lung inhalers for other symptoms.  HOME CARE INSTRUCTIONS   Only take over-the-counter or prescription medicines for pain, discomfort, or fever as directed by your caregiver.   Use a warm mist humidifier or inhale steam from a shower to increase air moisture. This may keep secretions moist and make it easier to breathe.   Drink enough water and fluids to keep your urine clear or pale yellow.   Rest as needed.   Return to work when your temperature has returned to normal or as your caregiver advises. You may need to stay home longer to avoid infecting others. You can also use a face mask and careful hand washing to prevent spread of the virus.  SEEK MEDICAL CARE IF:   After the first few days, you feel you are getting worse rather than better.   You need your caregiver's advice about medicines to control symptoms.   You develop chills, worsening shortness of breath, or brown or red sputum. These may be signs of pneumonia.   You develop yellow or brown nasal discharge or pain in the face, especially when you bend forward. These may be signs of sinusitis.   You develop a fever, swollen neck glands, pain with swallowing, or white areas in the back of your throat. These may be signs of strep throat.  SEEK IMMEDIATE MEDICAL CARE IF:   You have a fever.   You develop severe or persistent headache, ear pain, sinus pain, or chest pain.   You develop wheezing, a prolonged cough, cough up blood, or have a change in your usual mucus (if you have chronic lung disease).   You develop sore muscles or a stiff neck.  Document Released: 01/26/2001 Document Revised: 07/22/2011 Document Reviewed: 12/04/2010 [facility] Patient Information  2012 [place], [place].   RESOURCE GUIDE  Dental Problems  Patients with Medicaid: [facility] [phone] [address]                                           [phone] [address] Phone:  [phone]                                                  Phone:  [phone]  If unable to pay or uninsured, contact:  [facility] or [facility]. to become qualified for the adult dental clinic.  Chronic Pain Problems Contact [facility]  [phone] Patients need to be referred by their primary care doctor.  Insufficient Money for Medicine Contact United Way:  call "211" or [facility] [phone].  No Primary Care Doctor Call [facility]  [phone] Other agencies that provide inexpensive medical care    [facility]  [phone]    [facility]  [phone]    [facility]  [phone]    [facility]  [phone]    Planned Parenthood  [phone]    [facility]  [phone]  Psychological Services [facility]  [phone] [facility]  [phone] [facility]   [phone] (emergency services ([phone])  Substance Abuse Resources [facility]  2286333229 [facility] 9546125268 [facility] [phone] Floyd[facility] Flock ([phone] Residential & Outpatient Substance Abuse Program  ([phone]  Abuse/Neglect [facility] Child Abuse Hotline (920)392-3111 [facility] Child Abuse Hotline [phone] (After Hours)  Emergency Shelter [facility] [phone]  Maternity Homes [facility] 640-578-6537 [facility] (215)[phone]  MRSA Hotline #:   (712)739-6357    [facility] Resources  [facility] [place]     United Way                          [facility]. [address] [place]                        [address]      [address]  Blondell Reveal Phone:  [phone]                                   Phone:  [phone]                 Phone:  [phone]  [facility] Phone:  [phone]  [facility] Child Abuse Hotline (862)781-0899 [phone] (After Hours)

## 2011-11-10 NOTE — ED Notes (Signed)
Pt states he is breathing much better after the breathing tx.  PA Damen notified.

## 2011-11-11 NOTE — ED Provider Notes (Signed)
Medical screening examination/treatment/procedure(s) were performed by non-physician practitioner and as supervising physician I was immediately available for consultation/collaboration.   Scott Stokes. Oletta Lamas, MD 11/11/11 1610

## 2011-11-18 ENCOUNTER — Ambulatory Visit: Payer: BC Managed Care – PPO | Admitting: Cardiovascular Disease

## 2011-11-18 ENCOUNTER — Other Ambulatory Visit: Payer: Self-pay | Admitting: *Deleted

## 2011-11-18 ENCOUNTER — Encounter: Payer: Self-pay | Admitting: Cardiovascular Disease

## 2011-11-18 ENCOUNTER — Ambulatory Visit (INDEPENDENT_AMBULATORY_CARE_PROVIDER_SITE_OTHER): Payer: BC Managed Care – PPO | Admitting: Cardiovascular Disease

## 2011-11-18 VITALS — BP 139/82 | HR 53 | Ht 70.0 in | Wt 276.0 lb

## 2011-11-18 DIAGNOSIS — I2510 Atherosclerotic heart disease of native coronary artery without angina pectoris: Secondary | ICD-10-CM

## 2011-11-18 NOTE — Patient Instructions (Signed)
Your physician wants you to follow-up in: 6 months.  You will receive a reminder letter in the mail two months in advance. If you don't receive a letter, please call our office to schedule the follow-up appointment.  Your physician has recommended you make the following change in your medication: Stop Effient when you finish the bottle you have. Continue all other medications.

## 2011-11-18 NOTE — Assessment & Plan Note (Signed)
Stable. Will continue ASA/beta blocker and statin. Will stop Effient.

## 2011-11-18 NOTE — Progress Notes (Signed)
History of Present Illness: 51 yo WM with history of CAD, HTN, hyperlipidemia and DM who is here today for cardiac follow up. He has been followed in the past by Dr. Juanda Chance. In March of 2011 he had an anterior wall MI treated with a bare-metal stent to the LAD. His ejection fraction was 40% and improved to 60%. He has not tolerated simvastatin in the past but has tolerated Crestor.   He has been doing well. No chest pain, SOB, palpitations, near syncope, dizziness, syncope, LE edema. He has had bronchitis recently but is feeling better. He is walking daily.    Primary Care Physician: Dr. [name] with [facility] at [place].   Last Lipid Profile: March 2013: Total chol: 99  HDL 36  LDL 49  Past Medical History  Diagnosis Date  . Diabetes mellitus   . Hyperlipidemia   . Hypertension   . Coronary artery disease   . Upper respiratory infection     Past Surgical History  Procedure Date  . Appendectomy   . Laparoscopic cholecystectomy   . Coronary angioplasty with stent placement 10/2009    Current Outpatient Prescriptions  Medication Sig Dispense Refill  . aspirin EC 81 MG tablet Take 81 mg by mouth daily.      . carvedilol (COREG) 12.5 MG tablet Take 12.5 mg by mouth 2 (two) times daily.      . chlorpheniramine-HYDROcodone (TUSSIONEX PENNKINETIC ER) 10-8 MG/5ML LQCR Take 5 mLs by mouth every 12 (twelve) hours as needed.  140 mL  0  . fish oil-omega-3 fatty acids 1000 MG capsule Take 1-2 g by mouth daily. 2 tabs in the am and 1 tab. In the pm      . fluticasone (FLONASE) 50 MCG/ACT nasal spray 2 sprays by Nasal route daily.       Marland Kitchen lisinopril-hydrochlorothiazide (PRINZIDE,ZESTORETIC) 20-12.5 MG per tablet Take 1 tablet by mouth daily.       . nitroGLYCERIN (NITROSTAT) 0.4 MG SL tablet Place 0.4 mg under the tongue every 5 (five) minutes x 3 doses as needed. For chest pain.      . prasugrel (EFFIENT) 10 MG TABS Take 10 mg by mouth daily.      . rosuvastatin (CRESTOR) 10 MG tablet  Take 10 mg by mouth every other day.         Allergies  Allergen Reactions  . Simvastatin Nausea Only    History   Social History  . Marital Status: Single    Spouse Name: N/A    Number of Children: N/A  . Years of Education: N/A   Occupational History  . Works at [employer].    Social History Main Topics  . Smoking status: Former Games developer  . Smokeless tobacco: Not on file  . Alcohol Use: Yes     occassional  . Drug Use: No  . Sexually Active: Not on file   Other Topics Concern  . Not on file   Social History Narrative  . No narrative on file    Family History  Problem Relation Age of Onset  . Heart disease Neg Hx     Review of Systems:  As stated in the HPI and otherwise negative.   BP 139/82  Pulse 53  Ht 5\' 10"  (1.778 m)  Wt 276 lb (125.193 kg)  BMI 39.60 kg/m2  Physical Examination: General: Well developed, well nourished, NAD HEENT: OP clear, mucus membranes moist SKIN: warm, dry. No rashes. Neuro: No focal deficits Musculoskeletal: Muscle strength 5/5  all ext Psychiatric: Mood and affect normal Neck: No JVD, no carotid bruits, no thyromegaly, no lymphadenopathy. Lungs:Clear bilaterally, no wheezes, rhonci, crackles Cardiovascular: Regular rate and rhythm. No murmurs, gallops or rubs. Abdomen:Soft. Bowel sounds present. Non-tender.  Extremities: No lower extremity edema. Pulses are 2 + in the bilateral DP/PT.

## 2011-12-27 ENCOUNTER — Emergency Department (HOSPITAL_COMMUNITY)
Admission: EM | Admit: 2011-12-27 | Discharge: 2011-12-27 | Disposition: A | Discharge status: Home / Self Care | Payer: BC Managed Care – PPO | Source: Home / Self Care | Attending: Emergency Medicine | Admitting: Emergency Medicine

## 2011-12-27 ENCOUNTER — Encounter (HOSPITAL_COMMUNITY): Payer: Self-pay

## 2011-12-27 DIAGNOSIS — H8110 Benign paroxysmal vertigo, unspecified ear: Secondary | ICD-10-CM

## 2011-12-27 DIAGNOSIS — J309 Allergic rhinitis, unspecified: Secondary | ICD-10-CM

## 2011-12-27 HISTORY — DX: Other seasonal allergic rhinitis: J30.2

## 2011-12-27 MED ORDER — MECLIZINE HCL 25 MG PO TABS: 25.0000 mg | ORAL_TABLET | Freq: Four times a day (QID) | ORAL | Status: AC

## 2011-12-27 NOTE — ED Notes (Signed)
States he feels as if he has another sinus infection, as for past 3 days, he has had  pain in teeth and eyes, dizzy on movement, gets a sinus infection every year about this time; denies GI symptoms

## 2011-12-27 NOTE — ED Provider Notes (Signed)
Chief Complaint  Patient presents with  . Dizziness    History of Present Illness:  Scott Stokes is a 51 year old male who has had a 3 to four-day history of constant whirling vertigo which is worse with activities such as walking or working and better if he is completely still. This has been associated with slight nausea and a feeling of right ear congestion. He denies any vomiting. His had no fever, headache, or stiff neck. Denies diplopia or blurred vision. No numbness, paresthesias, weakness, or difficulty with speech or ambulation. For the past week he's had a flareup of his usual springtime allergy symptoms with nasal congestion, sneezing, and rhinorrhea with clear drainage. His eyes have been a little bit sore and a bit itching and watering, he's had a scratchy throat and felt somewhat chilled. He's been taking Claritin and Flonase. He has a history of heart disease and had a stent placed about 2 years ago. He denies any current cardiac symptoms including chest pain, tightness, pressure, palpitations, or syncope.  Review of Systems:  Other than noted above, the patient denies any of the following symptoms: Systemic:  No fever, chills, fatigue, or weight loss. Eye:  No blurred vision, visual change or diplopia. ENT:  No ear pain, tinnitus, hearing loss, nasal congestion, or rhinorrhea. Cardiac:  No chest pain, dyspnea, palpitations or syncope. Neuro:  No headache, paresthesias, weakness, trouble with speech, coordination or ambulation.  PMFSH:  Past medical history, family history, social history, meds, and allergies were reviewed.  Physical Exam:   Vital signs:  BP 95/52  Pulse 79  Temp(Src) 98.5 F (36.9 C) (Oral)  Resp 16  SpO2 98% General:  Alert, oriented times 3, in no distress. Eye:  PERRL, full EOM, no nystagmus. ENT:  TMs and canals normal.  Nasal mucosa normal.  Pharynx clear. Neck:  No adenopathy, tenderness, or mass.  Thyroid normal.  No carotid bruit. Lungs:  Breath sounds  clear and equal bilaterally.  No wheezes, rales or rhonchi. Heart:  Regular rhythm.  No gallops, murmers, or rubs. Neuro:  Alert and oriented times 3.  Cranial nerves intact.  No pronator drift.  Finger to nose normal.  No focal weakness.  Sensation intact to light touch.  Romberg's sign negative, gait normal.  Able to do tandem gait well.  The [facility] maneuver was positive with the right ear down and an Epley maneuver was then performed.  Course in Urgent Care Center:   He had an Epley maneuver performed here and I gave him the Epley exercises to do at home.   Assessment:  The primary encounter diagnosis was Benign positional vertigo. A diagnosis of Allergic rhinitis was also pertinent to this visit.  Plan:   1.  The following meds were prescribed:   New Prescriptions   MECLIZINE (ANTIVERT) 25 MG TABLET    Take 1 tablet (25 mg total) by mouth 4 (four) times daily.   2.  The patient was instructed in symptomatic care and handouts were given. 3.  The patient was told to return if becoming worse in any way, if no better in 3 or 4 days, and given some red flag symptoms that would indicate earlier return.    Reuben Likes, MD 12/27/11 1350

## 2011-12-27 NOTE — Discharge Instructions (Signed)
Allergic Rhinitis Allergic rhinitis is when the mucous membranes in the nose respond to allergens. Allergens are particles in the air that cause your body to have an allergic reaction. This causes you to release allergic antibodies. Through a chain of events, these eventually cause you to release histamine into the blood stream (hence the use of antihistamines). Although meant to be protective to the body, it is this release that causes your discomfort, such as frequent sneezing, congestion and an itchy runny nose.  CAUSES  The pollen allergens may come from grasses, trees, and weeds. This is seasonal allergic rhinitis, or "hay fever." Other allergens cause year-round allergic rhinitis (perennial allergic rhinitis) such as house dust mite allergen, pet dander and mold spores.  SYMPTOMS   Nasal stuffiness (congestion).   Runny, itchy nose with sneezing and tearing of the eyes.   There is often an itching of the mouth, eyes and ears.  It cannot be cured, but it can be controlled with medications. DIAGNOSIS  If you are unable to determine the offending allergen, skin or blood testing may find it. TREATMENT   Avoid the allergen.   Medications and allergy shots (immunotherapy) can help.   Hay fever may often be treated with antihistamines in pill or nasal spray forms. Antihistamines block the effects of histamine. There are over-the-counter medicines that may help with nasal congestion and swelling around the eyes. Check with your caregiver before taking or giving this medicine.  If the treatment above does not work, there are many new medications your caregiver can prescribe. Stronger medications may be used if initial measures are ineffective. Desensitizing injections can be used if medications and avoidance fails. Desensitization is when a patient is given ongoing shots until the body becomes less sensitive to the allergen. Make sure you follow up with your caregiver if problems continue. SEEK  MEDICAL CARE IF:   You develop fever (more than 100.5 F (38.1 C).   You develop a cough that does not stop easily (persistent).   You have shortness of breath.   You start wheezing.   Symptoms interfere with normal daily activities.  Document Released: 04/27/2001 Document Revised: 07/22/2011 Document Reviewed: 11/06/2008 [facility] Patient Information 2012 [place], [place].Benign Positional Vertigo Vertigo means you feel like you or your surroundings are moving when they are not. Benign positional vertigo is the most common form of vertigo. Benign means that the cause of your condition is not serious. Benign positional vertigo is more common in older adults. CAUSES  Benign positional vertigo is the result of an upset in the labyrinth system. This is an area in the middle ear that helps control your balance. This may be caused by a viral infection, head injury, or repetitive motion. However, often no specific cause is found. SYMPTOMS  Symptoms of benign positional vertigo occur when you move your head or eyes in different directions. Some of the symptoms may include:  Loss of balance and falls.   Vomiting.   Blurred vision.   Dizziness.   Nausea.   Involuntary eye movements (nystagmus).  DIAGNOSIS  Benign positional vertigo is usually diagnosed by physical exam. If the specific cause of your benign positional vertigo is unknown, your caregiver may perform imaging tests, such as magnetic resonance imaging (MRI) or computed tomography (CT). TREATMENT  Your caregiver may recommend movements or procedures to correct the benign positional vertigo. Medicines such as meclizine, benzodiazepines, and medicines for nausea may be used to treat your symptoms. In rare cases, if your symptoms are  caused by certain conditions that affect the inner ear, you may need surgery. HOME CARE INSTRUCTIONS   Follow your caregiver's instructions.   Move slowly. Do not make sudden body or head movements.     Avoid driving.   Avoid operating heavy machinery.   Avoid performing any tasks that would be dangerous to you or others during a vertigo episode.   Drink enough fluids to keep your urine clear or pale yellow.  SEEK IMMEDIATE MEDICAL CARE IF:   You develop problems with walking, weakness, numbness, or using your arms, hands, or legs.   You have difficulty speaking.   You develop severe headaches.   Your nausea or vomiting continues or gets worse.   You develop visual changes.   Your family or friends notice any behavioral changes.   Your condition gets worse.   You have a fever.   You develop a stiff neck or sensitivity to light.  MAKE SURE YOU:   Understand these instructions.   Will watch your condition.   Will get help right away if you are not doing well or get worse.  Document Released: 05/10/2006 Document Revised: 07/22/2011 Document Reviewed: 04/22/2011 [facility] Patient Information 2012 [place], [place].

## 2012-04-13 ENCOUNTER — Telehealth: Payer: Self-pay | Admitting: Cardiovascular Disease

## 2012-04-13 NOTE — Telephone Encounter (Signed)
plz return call to pt at hM# regarding crestor samples.

## 2012-04-13 NOTE — Telephone Encounter (Signed)
Samples of crestor put at front desk for pt

## 2012-05-18 ENCOUNTER — Encounter: Payer: Self-pay | Admitting: Cardiovascular Disease

## 2012-05-18 ENCOUNTER — Ambulatory Visit (INDEPENDENT_AMBULATORY_CARE_PROVIDER_SITE_OTHER): Payer: BC Managed Care – PPO | Admitting: Cardiovascular Disease

## 2012-05-18 VITALS — BP 121/73 | HR 61 | Ht 70.0 in | Wt 268.0 lb

## 2012-05-18 DIAGNOSIS — I2510 Atherosclerotic heart disease of native coronary artery without angina pectoris: Secondary | ICD-10-CM

## 2012-05-18 NOTE — Progress Notes (Signed)
History of Present Illness: 51 yo WM with history of CAD, HTN, hyperlipidemia and DM who is here today for cardiac follow up. He has been followed in the past by Dr. Juanda Chance. In March of 2011 he had an anterior wall MI treated with a bare-metal stent to the LAD. His ejection fraction was 40% and improved to 60%. He has not tolerated simvastatin in the past but has tolerated Crestor. He has enrolled in the ACCELERATE study.   He has been doing well. No chest pain, SOB, palpitations, near syncope, dizziness, syncope, LE edema. He is walking daily and is exercising in the gym every day.    Primary Care Physician: Dr. [name] with [facility] at [place].   Last Lipid Profile: March 2013: Total chol: 99 HDL 36 LDL 49  Past Medical History  Diagnosis Date  . Diabetes mellitus   . Hyperlipidemia   . Hypertension   . Coronary artery disease   . Upper respiratory infection   . Seasonal allergies     Past Surgical History  Procedure Date  . Appendectomy   . Laparoscopic cholecystectomy   . Coronary angioplasty with stent placement 10/2009    Current Outpatient Prescriptions  Medication Sig Dispense Refill  . albuterol (PROVENTIL HFA;VENTOLIN HFA) 108 (90 BASE) MCG/ACT inhaler Inhale 2 puffs into the lungs every 6 (six) hours as needed.      Marland Kitchen aspirin EC 81 MG tablet Take 81 mg by mouth daily.      . carvedilol (COREG) 12.5 MG tablet Take 12.5 mg by mouth 2 (two) times daily.      . chlorpheniramine-HYDROcodone (TUSSIONEX PENNKINETIC ER) 10-8 MG/5ML LQCR Take 5 mLs by mouth every 12 (twelve) hours as needed.  140 mL  0  . fish oil-omega-3 fatty acids 1000 MG capsule Take 1-2 g by mouth daily. 2 tabs in the am and 1 tab. In the pm      . fluticasone (FLONASE) 50 MCG/ACT nasal spray 2 sprays by Nasal route daily.       Marland Kitchen lisinopril-hydrochlorothiazide (PRINZIDE,ZESTORETIC) 20-12.5 MG per tablet Take 1 tablet by mouth daily.       . nitroGLYCERIN (NITROSTAT) 0.4 MG SL tablet Place 0.4 mg  under the tongue every 5 (five) minutes x 3 doses as needed. For chest pain.      . rosuvastatin (CRESTOR) 10 MG tablet Take 10 mg by mouth every other day.         Allergies  Allergen Reactions  . Simvastatin Nausea Only    History   Social History  . Marital Status: Single    Spouse Name: N/A    Number of Children: N/A  . Years of Education: N/A   Occupational History  . Works at [employer].    Social History Main Topics  . Smoking status: Former Games developer  . Smokeless tobacco: Not on file  . Alcohol Use: Yes     occassional  . Drug Use: No  . Sexually Active: Not on file   Other Topics Concern  . Not on file   Social History Narrative  . No narrative on file    Family History  Problem Relation Age of Onset  . Heart disease Neg Hx     Review of Systems:  As stated in the HPI and otherwise negative.   BP 121/73  Pulse 61  Ht 5\' 10"  (1.778 m)  Wt 268 lb (121.564 kg)  BMI 38.45 kg/m2  Physical Examination: General: Well developed, well nourished, NAD  HEENT: OP clear, mucus membranes moist SKIN: warm, dry. No rashes. Neuro: No focal deficits Musculoskeletal: Muscle strength 5/5 all ext Psychiatric: Mood and affect normal Neck: No JVD, no carotid bruits, no thyromegaly, no lymphadenopathy. Lungs:Clear bilaterally, no wheezes, rhonci, crackles Cardiovascular: Regular rate and rhythm. No murmurs, gallops or rubs. Abdomen:Soft. Bowel sounds present. Non-tender.  Extremities: No lower extremity edema. Pulses are 2 + in the bilateral DP/PT.  EKG: NSR, rate 63 bpm. LAFB. Poor R wave progression precordial leads.   Assessment and Plan:   1. CAD: Stable. BP is well controlled. Lipids are controlled in March 2013. He is in the ACCELERATE study protocol. Most recent lipids are not known at this time. No changes today.

## 2012-05-18 NOTE — Patient Instructions (Addendum)
Your physician wants you to follow-up in:  12 months.  You will receive a reminder letter in the mail two months in advance. If you don't receive a letter, please call our office to schedule the follow-up appointment.   

## 2012-05-24 ENCOUNTER — Other Ambulatory Visit: Payer: Self-pay | Admitting: Cardiovascular Disease

## 2012-05-24 MED ORDER — CARVEDILOL 12.5 MG PO TABS: 12.5000 mg | ORAL_TABLET | Freq: Two times a day (BID) | ORAL | Status: DC

## 2012-11-20 ENCOUNTER — Encounter (INDEPENDENT_AMBULATORY_CARE_PROVIDER_SITE_OTHER): Payer: BC Managed Care – PPO

## 2012-11-20 DIAGNOSIS — R0989 Other specified symptoms and signs involving the circulatory and respiratory systems: Secondary | ICD-10-CM

## 2012-12-28 ENCOUNTER — Other Ambulatory Visit: Payer: Self-pay

## 2012-12-28 IMAGING — CR DG CHEST 2V
2 series · 2 of 2 positions shown · non-contrast
Comparison: 05/17/2010

CLINICAL DATA: 51-year-old male with fever, cough and shortness of
breath.

CHEST - 2 VIEW

[w chest pa]
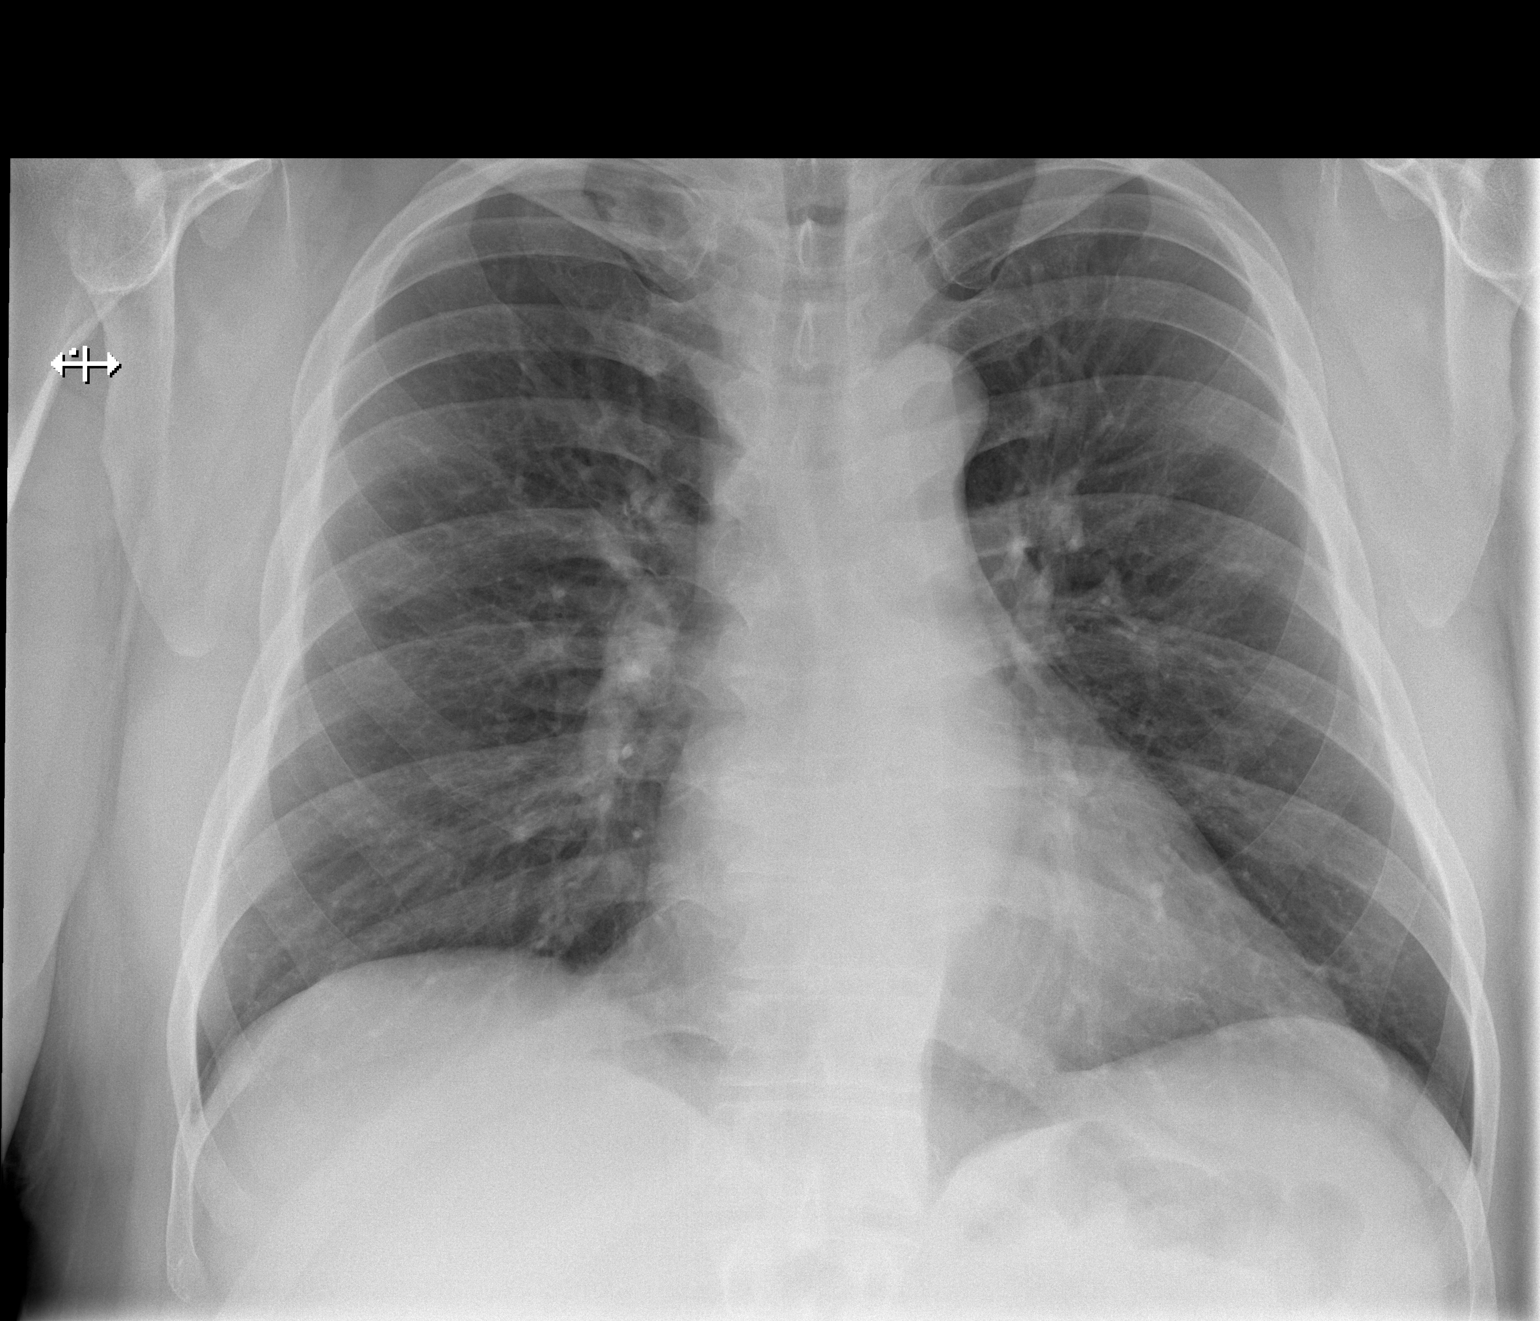

[w chest lat]
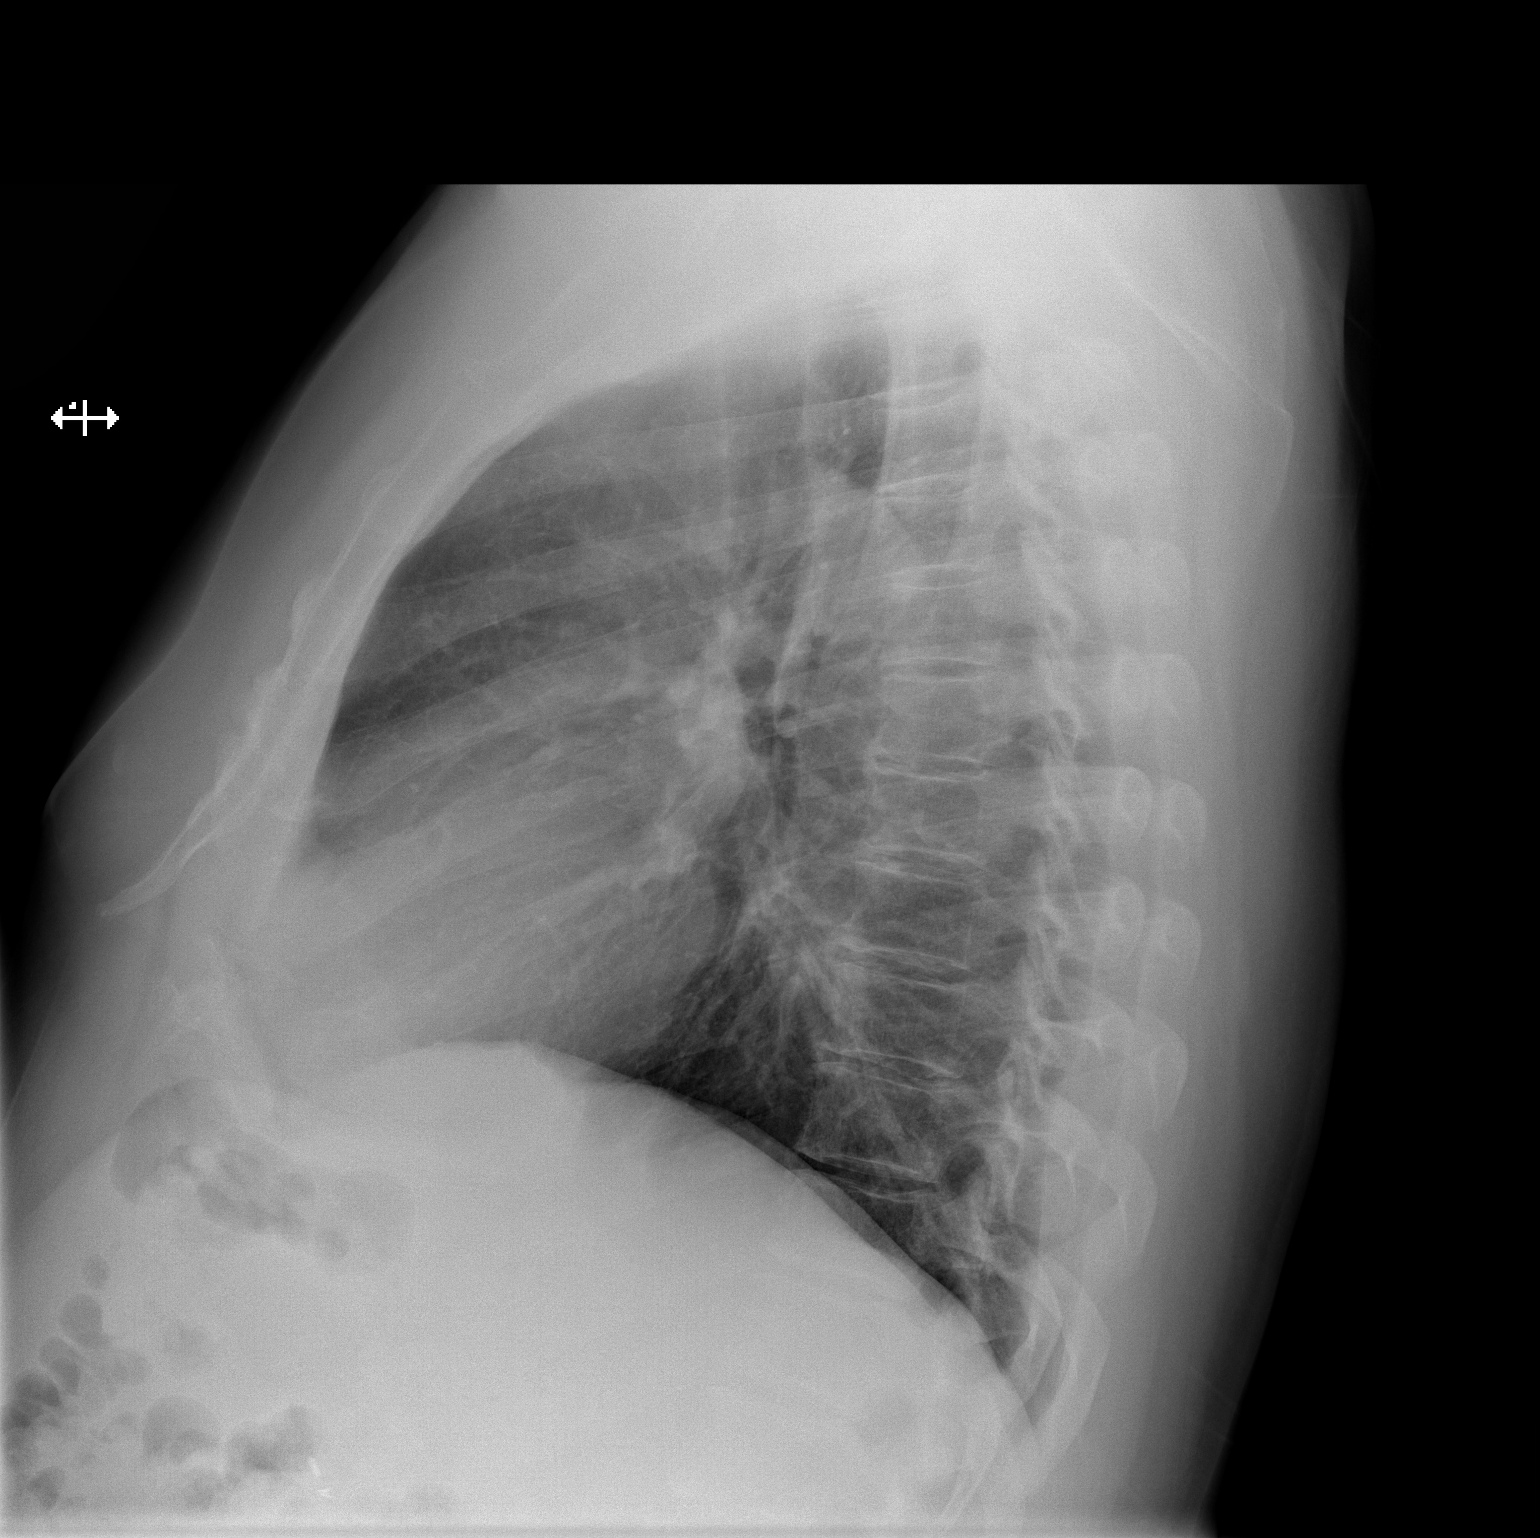

[2 of 2 positions shown; findings below may reference images not displayed]

FINDINGS: The cardiomediastinal silhouette is unremarkable.
There is no evidence of focal airspace disease, pulmonary edema,
suspicious pulmonary nodule/mass, pleural effusion, or
pneumothorax.
No acute bony abnormalities are identified.
IMPRESSION: No evidence of acute cardiopulmonary disease.

## 2012-12-28 MED ORDER — CARVEDILOL 12.5 MG PO TABS: 12.5000 mg | ORAL_TABLET | Freq: Two times a day (BID) | ORAL | Status: DC

## 2013-04-18 ENCOUNTER — Encounter (HOSPITAL_COMMUNITY): Payer: Self-pay | Admitting: *Deleted

## 2013-04-18 ENCOUNTER — Emergency Department (HOSPITAL_COMMUNITY)
Admission: EM | Admit: 2013-04-18 | Discharge: 2013-04-18 | Disposition: A | Discharge status: Home / Self Care | Payer: BC Managed Care – PPO | Source: Home / Self Care

## 2013-04-18 DIAGNOSIS — M1710 Unilateral primary osteoarthritis, unspecified knee: Secondary | ICD-10-CM

## 2013-04-18 MED ORDER — TRAMADOL HCL 50 MG PO TABS: 50.0000 mg | ORAL_TABLET | Freq: Four times a day (QID) | ORAL | Status: DC | PRN

## 2013-04-18 NOTE — ED Notes (Signed)
Pt c/o knee swelling, tightness, and pain x 4 days. Pt states he has been working at [employer] on his feet walking constantly 7 days a week for several months now. Pt states he alternated heat and ice tx with no relief. Jan Ranson, SMA

## 2013-04-18 NOTE — ED Provider Notes (Signed)
CSN: [id number]     Arrival date & time 04/18/13  1014 History   None    Chief Complaint  Patient presents with  . Joint Swelling    right knee   (Consider location/radiation/quality/duration/timing/severity/associated sxs/prior Treatment) HPI Comments: 52 year old male presents complaining of left knee tightness, swelling, and mild pain for the past 4 days. This began when he woke up Sunday morning, he had no pain whatsoever when he went to bed Saturday night. He notes that he has had a significantly increased level of physical activity at work, working 7 days a week walking long distances all day long. He has tightness in the lateral and superior knee that loosens up throughout the day but then becomes sore by the end of the day. He has been icing the knee and applying heat intermittently as needed. He feels like the day it had started to improve but got sore again as he was working. He denies any specific areas of pain in the knee. He denies history of similar symptoms. He has no fever, chills, or pain/swelling elsewhere. Denies specific injury   Past Medical History  Diagnosis Date  . Diabetes mellitus   . Hyperlipidemia   . Hypertension   . Coronary artery disease   . Upper respiratory infection   . Seasonal allergies    Past Surgical History  Procedure Laterality Date  . Appendectomy    . Laparoscopic cholecystectomy    . Coronary angioplasty with stent placement  10/2009   Family History  Problem Relation Age of Onset  . Heart disease Neg Hx    History  Substance Use Topics  . Smoking status: Former Games developer  . Smokeless tobacco: Not on file  . Alcohol Use: Yes     Comment: occassional    Review of Systems  Constitutional: Negative for fever, chills and fatigue.  HENT: Negative for sore throat, neck pain and neck stiffness.   Eyes: Negative for visual disturbance.  Respiratory: Negative for cough and shortness of breath.   Cardiovascular: Negative for chest pain,  palpitations and leg swelling.  Gastrointestinal: Negative for nausea, vomiting, abdominal pain, diarrhea and constipation.  Genitourinary: Negative for dysuria, urgency, frequency and hematuria.  Musculoskeletal: Positive for joint swelling and arthralgias. Negative for myalgias.  Skin: Negative for rash.  Neurological: Negative for dizziness, weakness and light-headedness.    Allergies  Simvastatin  Home Medications   Current Outpatient Rx  Name  Route  Sig  Dispense  Refill  . albuterol (PROVENTIL HFA;VENTOLIN HFA) 108 (90 BASE) MCG/ACT inhaler   Inhalation   Inhale 2 puffs into the lungs every 6 (six) hours as needed.         Marland Kitchen aspirin EC 81 MG tablet   Oral   Take 81 mg by mouth daily.         . carvedilol (COREG) 12.5 MG tablet   Oral   Take 1 tablet (12.5 mg total) by mouth 2 (two) times daily.   60 tablet   6   . chlorpheniramine-HYDROcodone (TUSSIONEX PENNKINETIC ER) 10-8 MG/5ML LQCR   Oral   Take 5 mLs by mouth every 12 (twelve) hours as needed.   140 mL   0   . fish oil-omega-3 fatty acids 1000 MG capsule   Oral   Take 1-2 g by mouth daily. 2 tabs in the am and 1 tab. In the pm         . fluticasone (FLONASE) 50 MCG/ACT nasal spray   Nasal  2 sprays by Nasal route daily.          Marland Kitchen lisinopril-hydrochlorothiazide (PRINZIDE,ZESTORETIC) 20-12.5 MG per tablet   Oral   Take 1 tablet by mouth daily.          . nitroGLYCERIN (NITROSTAT) 0.4 MG SL tablet   Sublingual   Place 0.4 mg under the tongue every 5 (five) minutes x 3 doses as needed. For chest pain.         . rosuvastatin (CRESTOR) 10 MG tablet   Oral   Take 10 mg by mouth every other day.          . STUDY MEDICATION      acceralted study         . traMADol (ULTRAM) 50 MG tablet   Oral   Take 1 tablet (50 mg total) by mouth every 6 (six) hours as needed for pain.   30 tablet   0    BP 126/71  Pulse 76  Temp(Src) 99.5 F (37.5 C) (Oral)  Resp 16  SpO2 99% Physical Exam   Constitutional: He is oriented to person, place, and time. He appears well-developed and well-nourished. No distress.  HENT:  Head: Normocephalic and atraumatic.  Pulmonary/Chest: Effort normal. No respiratory distress.  Musculoskeletal:       Left knee: He exhibits decreased range of motion, swelling and effusion (mild suprapatellar effusion). He exhibits no ecchymosis, no deformity, no erythema, normal alignment, no LCL laxity, normal patellar mobility, no bony tenderness and no MCL laxity. No tenderness found.  Neurological: He is alert and oriented to person, place, and time. No sensory deficit. He exhibits normal muscle tone. Coordination normal.  Skin: Skin is warm. No rash noted. He is not diaphoretic.  Psychiatric: He has a normal mood and affect. Judgment normal.    ED Course  Procedures (including critical care time) Labs Review Labs Reviewed - No data to display Imaging Review No results found.  MDM   1. Arthritis of knee    Effusion without specific signs of inflammation. There is tightness that loosens up with activity. This is most likely  related to arthritis. He has a history of coronary artery disease and diabetes so cannot use any anti-inflammatories, corticosteroid or NSAIDs. Treating with tramadol, ice, elevation, and rest from work for a few days. Advised to work on range of motion activities daily. He will followup with orthopedics if not improving  Meds ordered this encounter  Medications  . traMADol (ULTRAM) 50 MG tablet    Sig: Take 1 tablet (50 mg total) by mouth every 6 (six) hours as needed for pain.    Dispense:  30 tablet    Refill:  0       Graylon Good, PA-C 04/18/13 1144

## 2013-04-18 NOTE — ED Provider Notes (Signed)
Medical screening examination/treatment/procedure(s) were performed by non-physician practitioner and as supervising physician I was immediately available for consultation/collaboration.   MORENO-COLL,Taelyn Broecker; MD  Jesenya Bowditch Moreno-Coll, MD 04/18/13 1415 

## 2013-06-20 ENCOUNTER — Encounter: Payer: Self-pay | Admitting: Cardiovascular Disease

## 2013-06-20 ENCOUNTER — Ambulatory Visit (INDEPENDENT_AMBULATORY_CARE_PROVIDER_SITE_OTHER): Payer: BC Managed Care – PPO | Admitting: Cardiovascular Disease

## 2013-06-20 VITALS — BP 142/90 | HR 52 | Ht 70.0 in | Wt 271.0 lb

## 2013-06-20 DIAGNOSIS — I2510 Atherosclerotic heart disease of native coronary artery without angina pectoris: Secondary | ICD-10-CM

## 2013-06-20 MED ORDER — NITROGLYCERIN 0.4 MG SL SUBL: 0.4000 mg | SUBLINGUAL_TABLET | SUBLINGUAL | Status: DC | PRN

## 2013-06-20 NOTE — Progress Notes (Addendum)
History of Present Illness: 52 yo WM with history of CAD, HTN, hyperlipidemia and DM who is here today for cardiac follow up. He has been followed in the past by Dr. Juanda Chance. In March of 2011 he had an anterior wall MI treated with a bare-metal stent to the LAD. His ejection fraction was 40% and improved to 60%. He has not tolerated simvastatin in the past but has tolerated Crestor.  He has been doing well. No chest pain, SOB, palpitations, near syncope, dizziness, syncope, LE edema. He is walking daily and is exercising in the gym every day.    Primary Care Physician: Dr. [name] with [facility] at [place].   Last Lipid Profile: Followed in primary care and in the study  Past Medical History  Diagnosis Date  . Diabetes mellitus   . Hyperlipidemia   . Hypertension   . Coronary artery disease   . Upper respiratory infection   . Seasonal allergies     Past Surgical History  Procedure Laterality Date  . Appendectomy    . Laparoscopic cholecystectomy    . Coronary angioplasty with stent placement  10/2009    Current Outpatient Prescriptions  Medication Sig Dispense Refill  . aspirin EC 81 MG tablet Take 81 mg by mouth daily.      . carvedilol (COREG) 12.5 MG tablet Take 1 tablet (12.5 mg total) by mouth 2 (two) times daily.  60 tablet  6  . chlorpheniramine-HYDROcodone (TUSSIONEX PENNKINETIC ER) 10-8 MG/5ML LQCR Take 5 mLs by mouth every 12 (twelve) hours as needed.  140 mL  0  . fish oil-omega-3 fatty acids 1000 MG capsule Take 1-2 g by mouth daily. 2 tabs in the am and 1 tab. In the pm      . fluticasone (FLONASE) 50 MCG/ACT nasal spray 2 sprays by Nasal route daily.       Marland Kitchen lisinopril-hydrochlorothiazide (PRINZIDE,ZESTORETIC) 20-12.5 MG per tablet Take 1 tablet by mouth daily.       . nitroGLYCERIN (NITROSTAT) 0.4 MG SL tablet Place 0.4 mg under the tongue every 5 (five) minutes x 3 doses as needed. For chest pain.      . rosuvastatin (CRESTOR) 10 MG tablet Take 10 mg by  mouth every other day.       . STUDY MEDICATION acceralted study       No current facility-administered medications for this visit.    Allergies  Allergen Reactions  . Simvastatin Nausea Only    History   Social History  . Marital Status: Single    Spouse Name: N/A    Number of Children: N/A  . Years of Education: N/A   Occupational History  . Works at [employer].    Social History Main Topics  . Smoking status: Former Games developer  . Smokeless tobacco: Not on file  . Alcohol Use: Yes     Comment: occassional  . Drug Use: No  . Sexual Activity: Not on file   Other Topics Concern  . Not on file   Social History Narrative  . No narrative on file    Family History  Problem Relation Age of Onset  . Heart disease Neg Hx     Review of Systems:  As stated in the HPI and otherwise negative.   BP 142/90  Pulse 52  Ht 5\' 10"  (1.778 m)  Wt 271 lb (122.925 kg)  BMI 38.88 kg/m2  Physical Examination: General: Well developed, well nourished, NAD HEENT: OP clear, mucus membranes moist SKIN:  warm, dry. No rashes. Neuro: No focal deficits Musculoskeletal: Muscle strength 5/5 all ext Psychiatric: Mood and affect normal Neck: No JVD, no carotid bruits, no thyromegaly, no lymphadenopathy. Lungs:Clear bilaterally, no wheezes, rhonci, crackles Cardiovascular: Regular rate and rhythm. No murmurs, gallops or rubs. Abdomen:Soft. Bowel sounds present. Non-tender.  Extremities: No lower extremity edema. Pulses are 2 + in the bilateral DP/PT.  EKG: sinus brady, 52 bpm. Old septal infarct.   Assessment and Plan:   1. CAD: Stable. BP is well controlled. Lipids are followed in primary care and per ACCELERATE study protocol. He has had no ischemic testing since his stent was placed in 2011. Will arrange exercise treadmill stress test to exclude ischemia.   2. HTN: BP is borderline. Follow at home  3. Hyperlipidemia: Continue statin

## 2013-06-20 NOTE — Patient Instructions (Signed)
Your physician wants you to follow-up in:  12 months. You will receive a reminder letter in the mail two months in advance. If you don't receive a letter, please call our office to schedule the follow-up appointment.  Your physician has requested that you have an exercise tolerance test. For further information please visit [email or web address]. Please also follow instruction sheet, as given.    

## 2013-06-20 NOTE — Addendum Note (Signed)
Addended by: [name] D on: 06/20/2013 02:34 PM   Modules accepted: Level of Service

## 2013-07-30 ENCOUNTER — Ambulatory Visit (INDEPENDENT_AMBULATORY_CARE_PROVIDER_SITE_OTHER): Payer: BC Managed Care – PPO | Admitting: Nurse Practitioner

## 2013-07-30 ENCOUNTER — Encounter: Payer: Self-pay | Admitting: Nurse Practitioner

## 2013-07-30 VITALS — BP 131/72 | HR 80

## 2013-07-30 DIAGNOSIS — I2510 Atherosclerotic heart disease of native coronary artery without angina pectoris: Secondary | ICD-10-CM

## 2013-07-30 NOTE — Progress Notes (Signed)
Exercise Treadmill Test  Pre-Exercise Testing Evaluation Rhythm: normal sinus  Rate: 67     Test  Exercise Tolerance Test Ordering MD: Melene Muller, MD  Interpreting MD: Norma Fredrickson, NP  Unique Test No: 1  Treadmill:  1  Indication for ETT: known ASHD  Contraindication to ETT: No   Stress Modality: exercise - treadmill  Cardiac Imaging Performed: non   Protocol: standard Bruce - maximal  Max BP:  181/66  Max MPHR (bpm):  168 85% MPR (bpm):  143  MPHR obtained (bpm):  144 % MPHR obtained:  86%  Reached 85% MPHR (min:sec):  7:55 Total Exercise Time (min-sec):  8 minutes  Workload in METS:  10.1 Borg Scale: 17  Reason ETT Terminated:  patient's desire to stop    ST Segment Analysis At Rest: normal ST segments - no evidence of significant ST depression With Exercise: no evidence of significant ST depression  Other Information Arrhythmia:  Yes - occasional PVCs Angina during ETT:  absent (0) Quality of ETT:  diagnostic  ETT Interpretation:  normal - no evidence of ischemia by ST analysis  Comments: Patient presents today for routine GXT. Has known CAD, HTN HLD and DM. Has had prior PCI with anterior MI in 2011.   Today the patient exercised on the standard Bruce protocol for a total of 8 minutes.  Fair exercise tolerance.  Adequate blood pressure response.  Clinically negative for chest pain. Test was stopped due to fatigue/achievement of target HR.  EKG negative for ischemia. The resting EKG is abnormal. Occasional PVCs in early recovery noted. No significant arrhythmia.   Recommendations: CV risk factor modification.  See back in one year.  Patient is agreeable to this plan and will call if any problems develop in the interim.   Rosalio Macadamia, RN, ANP-C [facility] [address] [place], [place]  [address]

## 2013-08-28 ENCOUNTER — Other Ambulatory Visit: Payer: Self-pay | Admitting: Cardiovascular Disease

## 2013-10-29 ENCOUNTER — Encounter: Payer: Self-pay | Admitting: Cardiology

## 2013-12-24 ENCOUNTER — Encounter: Payer: Self-pay | Admitting: Cardiology

## 2014-03-16 ENCOUNTER — Other Ambulatory Visit: Payer: Self-pay | Admitting: Cardiovascular Disease

## 2014-05-20 ENCOUNTER — Other Ambulatory Visit: Payer: Self-pay | Admitting: Cardiovascular Disease

## 2014-06-03 ENCOUNTER — Encounter: Payer: Self-pay | Admitting: Cardiovascular Disease

## 2014-06-14 ENCOUNTER — Encounter: Payer: Self-pay | Admitting: Cardiovascular Disease

## 2014-06-22 ENCOUNTER — Other Ambulatory Visit: Payer: Self-pay | Admitting: Cardiovascular Disease

## 2014-07-04 ENCOUNTER — Ambulatory Visit: Payer: BC Managed Care – PPO | Admitting: Cardiovascular Disease

## 2014-07-24 ENCOUNTER — Other Ambulatory Visit: Payer: Self-pay | Admitting: Cardiovascular Disease

## 2014-09-11 ENCOUNTER — Telehealth: Payer: Self-pay

## 2014-09-11 NOTE — Telephone Encounter (Signed)
Patient called for samples of crestor placed them up front 

## 2014-10-08 NOTE — Progress Notes (Signed)
History of Present Illness: 54 yo WM with history of CAD, HTN, hyperlipidemia and DM who is here today for cardiac follow up. He has been followed in the past by Dr. Juanda Chance. In March of 2011 he had an anterior wall MI treated with a bare-metal stent to the LAD. His ejection fraction was 40% and improved to 60%. He has tolerated Crestor. Treadmill stress test 07/30/13 with good exercise tolerance, no ischemia.   He is here today for follow up. He has been doing well. No chest pain, SOB, palpitations, near syncope, dizziness, syncope, LE edema. He is walking daily and is exercising in the gym every day.    Primary Care Physician: Dr. [name] with [facility].   Last Lipid Profile: Followed in primary care   Past Medical History  Diagnosis Date  . Diabetes mellitus   . Hyperlipidemia   . Hypertension   . Coronary artery disease   . Upper respiratory infection   . Seasonal allergies     Past Surgical History  Procedure Laterality Date  . Appendectomy    . Laparoscopic cholecystectomy    . Coronary angioplasty with stent placement  10/2009    Current Outpatient Prescriptions  Medication Sig Dispense Refill  . aspirin EC 81 MG tablet Take 81 mg by mouth daily.    . carvedilol (COREG) 6.25 MG tablet TAKE TWO TABLETS BY MOUTH TWICE DAILY 120 tablet 2  . fish oil-omega-3 fatty acids 1000 MG capsule Take 1-2 g by mouth daily. 2 tabs in the am and 1 tab. In the pm    . fluticasone (FLONASE) 50 MCG/ACT nasal spray 2 sprays by Nasal route daily.     Marland Kitchen lisinopril-hydrochlorothiazide (PRINZIDE,ZESTORETIC) 20-12.5 MG per tablet Take 1 tablet by mouth daily.     Marland Kitchen NITROSTAT 0.4 MG SL tablet DISSOLVE ONE TABLET UNDER THE TONGUE EVERY 5 MINUTES AS NEEDED FOR CHEST PAIN.  DO NOT EXCEED A TOTAL OF 3 DOSES IN 15 MINUTES 25 tablet 0  . rosuvastatin (CRESTOR) 10 MG tablet Take 10 mg by mouth every other day.      No current facility-administered medications for this visit.     Allergies  Allergen Reactions  . Simvastatin Nausea Only    History   Social History  . Marital Status: Single    Spouse Name: N/A  . Number of Children: N/A  . Years of Education: N/A   Occupational History  . Works at [employer].    Social History Main Topics  . Smoking status: Former Games developer  . Smokeless tobacco: Not on file  . Alcohol Use: Yes     Comment: occassional  . Drug Use: No  . Sexual Activity: Not on file   Other Topics Concern  . Not on file   Social History Narrative    Family History  Problem Relation Age of Onset  . Heart disease Neg Hx     Review of Systems:  As stated in the HPI and otherwise negative.   BP 118/84 mmHg  Pulse 60  Ht  (1.778 m)  Wt 299 lb 12.8 oz (135.988 kg)  BMI 43.02 kg/m2  Physical Examination: General: Well developed, well nourished, NAD HEENT: OP clear, mucus membranes moist SKIN: warm, dry. No rashes. Neuro: No focal deficits Musculoskeletal: Muscle strength 5/5 all ext Psychiatric: Mood and affect normal Neck: No JVD, no carotid bruits, no thyromegaly, no lymphadenopathy. Lungs:Clear bilaterally, no wheezes, rhonci, crackles Cardiovascular: Regular rate and rhythm. No murmurs, gallops  or rubs. Abdomen:Soft. Bowel sounds present. Non-tender.  Extremities: No lower extremity edema. Pulses are 2 + in the bilateral DP/PT.  EKG: NSR, rate 60 bpm.   Assessment and Plan:   1. CAD: Stable. No recent chest pains. Will continue ASA, statin, beta blocker.   2. HTN: BP is controlled. No changes today.   3. Hyperlipidemia: Continue statin. He is tolerating low dose Crestor every other day. Lipids followed in primary care.

## 2014-10-09 ENCOUNTER — Ambulatory Visit (INDEPENDENT_AMBULATORY_CARE_PROVIDER_SITE_OTHER): Payer: BLUE CROSS/BLUE SHIELD | Admitting: Cardiovascular Disease

## 2014-10-09 ENCOUNTER — Encounter: Payer: Self-pay | Admitting: Cardiovascular Disease

## 2014-10-09 VITALS — BP 118/84 | HR 60 | Ht 70.0 in | Wt 299.8 lb

## 2014-10-09 DIAGNOSIS — I2510 Atherosclerotic heart disease of native coronary artery without angina pectoris: Secondary | ICD-10-CM

## 2014-10-09 DIAGNOSIS — I10 Essential (primary) hypertension: Secondary | ICD-10-CM

## 2014-10-09 DIAGNOSIS — E785 Hyperlipidemia, unspecified: Secondary | ICD-10-CM

## 2014-10-09 NOTE — Patient Instructions (Signed)
Your physician wants you to follow-up in:  12 months.  You will receive a reminder letter in the mail two months in advance. If you don't receive a letter, please call our office to schedule the follow-up appointment.   

## 2014-10-30 ENCOUNTER — Other Ambulatory Visit: Payer: Self-pay | Admitting: Cardiovascular Disease

## 2014-11-14 ENCOUNTER — Other Ambulatory Visit: Payer: Self-pay | Admitting: *Deleted

## 2014-11-14 MED ORDER — ROSUVASTATIN CALCIUM 10 MG PO TABS: 10.0000 mg | ORAL_TABLET | ORAL | Status: DC

## 2014-11-18 ENCOUNTER — Telehealth: Payer: Self-pay | Admitting: *Deleted

## 2014-11-18 NOTE — Telephone Encounter (Signed)
Received faxed request from [employer] for prior auth for Crestor.  I called [employer] to do prior auth over phone but they cannot do over the phone.  Rep from [employer] states form that I have is not correct form and they have no record of it being faxed to us.  Rep verified prior Berkley Harveyauth is required and will fax correct form to our office.

## 2014-11-21 NOTE — Telephone Encounter (Signed)
New form received in office. Form completed and faxed to [employer].

## 2014-11-25 NOTE — Telephone Encounter (Signed)
Received fax from [employer] Prior Auth Department that Crestor is approved through 11/21/15. [facility] notified

## 2015-03-28 ENCOUNTER — Other Ambulatory Visit: Payer: Self-pay | Admitting: *Deleted

## 2015-03-28 MED ORDER — NITROGLYCERIN 0.4 MG SL SUBL: SUBLINGUAL_TABLET | SUBLINGUAL | Status: DC

## 2015-03-28 MED ORDER — ROSUVASTATIN CALCIUM 10 MG PO TABS: 10.0000 mg | ORAL_TABLET | ORAL | Status: DC

## 2015-03-28 MED ORDER — CARVEDILOL 6.25 MG PO TABS: 12.5000 mg | ORAL_TABLET | Freq: Two times a day (BID) | ORAL | Status: DC

## 2015-04-06 ENCOUNTER — Other Ambulatory Visit: Payer: Self-pay | Admitting: Cardiovascular Disease

## 2015-05-21 ENCOUNTER — Other Ambulatory Visit: Payer: Self-pay | Admitting: Cardiovascular Disease

## 2015-09-07 ENCOUNTER — Other Ambulatory Visit: Payer: Self-pay | Admitting: Cardiovascular Disease

## 2015-11-26 DIAGNOSIS — J3089 Other allergic rhinitis: Secondary | ICD-10-CM | POA: Diagnosis not present

## 2015-12-06 ENCOUNTER — Other Ambulatory Visit: Payer: Self-pay | Admitting: Cardiovascular Disease

## 2015-12-09 ENCOUNTER — Ambulatory Visit (HOSPITAL_COMMUNITY)
Admission: EM | Admit: 2015-12-09 | Discharge: 2015-12-09 | Disposition: A | Discharge status: Home / Self Care | Payer: BLUE CROSS/BLUE SHIELD | Attending: Emergency Medicine | Admitting: Emergency Medicine

## 2015-12-09 ENCOUNTER — Encounter (HOSPITAL_COMMUNITY): Payer: Self-pay | Admitting: Emergency Medicine

## 2015-12-09 DIAGNOSIS — L089 Local infection of the skin and subcutaneous tissue, unspecified: Secondary | ICD-10-CM

## 2015-12-09 DIAGNOSIS — S20361A Insect bite (nonvenomous) of right front wall of thorax, initial encounter: Secondary | ICD-10-CM | POA: Diagnosis not present

## 2015-12-09 DIAGNOSIS — W57XXXA Bitten or stung by nonvenomous insect and other nonvenomous arthropods, initial encounter: Principal | ICD-10-CM

## 2015-12-09 MED ORDER — HYDROCORTISONE 2.5 % EX CREA: TOPICAL_CREAM | Freq: Two times a day (BID) | CUTANEOUS | Status: DC | PRN

## 2015-12-09 MED ORDER — CEPHALEXIN 500 MG PO CAPS: 500.0000 mg | ORAL_CAPSULE | Freq: Four times a day (QID) | ORAL | Status: DC

## 2015-12-09 NOTE — ED Provider Notes (Signed)
CSN: [id number]     Arrival date & time 12/09/15  1531 History   First MD Initiated Contact with Patient 12/09/15 1546     Chief Complaint  Patient presents with  . Insect Bite   (Consider location/radiation/quality/duration/timing/severity/associated sxs/prior Treatment) HPI He is a 55 year old man here for evaluation of insect bite. He states he noticed insect bite Saturday night. It is on the right collarbone. Over the last several days he has noticed increased swelling and redness. He denies any pain, but reports persistent itching. No fevers or chills.  Past Medical History  Diagnosis Date  . Diabetes mellitus   . Hyperlipidemia   . Hypertension   . Coronary artery disease   . Upper respiratory infection   . Seasonal allergies    Past Surgical History  Procedure Laterality Date  . Appendectomy    . Laparoscopic cholecystectomy    . Coronary angioplasty with stent placement  10/2009   Family History  Problem Relation Age of Onset  . Heart disease Neg Hx    Social History  Substance Use Topics  . Smoking status: Former Games developermoker  . Smokeless tobacco: None  . Alcohol Use: Yes     Comment: occassional    Review of Systems as in history of present illness Allergies  Simvastatin  Home Medications   Prior to Admission medications   Medication Sig Start Date End Date Taking? Authorizing Provider  aspirin EC 81 MG tablet Take 81 mg by mouth daily.    Historical Provider, MD  carvedilol (COREG) 6.25 MG tablet Take 2 tablets by mouth  twice a day 12/08/15   Kathleene Hazelhristopher D McAlhany, MD  cephALEXin (KEFLEX) 500 MG capsule Take 1 capsule (500 mg total) by mouth 4 (four) times daily. 12/09/15   Charm RingsErin J Johnluke Haugen, MD  fish oil-omega-3 fatty acids 1000 MG capsule Take 1-2 g by mouth daily. 2 tabs in the am and 1 tab. In the pm    Kathleene Hazelhristopher D McAlhany, MD  fluticasone [facility](FLONASE) 50 MCG/ACT nasal spray 2 sprays by Nasal route daily.     Historical Provider, MD  hydrocortisone 2.5 % cream  Apply topically 2 (two) times daily as needed. Itching 12/09/15   Charm RingsErin J Jionni Helming, MD  lisinopril-hydrochlorothiazide (PRINZIDE,ZESTORETIC) 20-12.5 MG per tablet Take 1 tablet by mouth daily.     Kathleene Hazelhristopher D McAlhany, MD  NITROSTAT 0.4 MG SL tablet Dissolve 1 tablet under the tongue every 5 minutes as  needed for chest pain (do  not exceed a total of 3  doses in 15 minutes) 04/07/15   Kathleene Hazelhristopher D McAlhany, MD  rosuvastatin (CRESTOR) 10 MG tablet Take 1 tablet by mouth  every other day 12/08/15   Kathleene Hazelhristopher D McAlhany, MD   Meds Ordered and Administered this Visit  Medications - No data to display  BP 142/89 mmHg  Pulse 69  Temp(Src) 98.2 F (36.8 C) (Oral)  Resp 18  SpO2 97% No data found.   Physical Exam  Constitutional: He is oriented to person, place, and time. He appears well-developed and well-nourished. No distress.  Cardiovascular: Normal rate.   Pulmonary/Chest: Effort normal.  Neurological: He is alert and oriented to person, place, and time.  Skin:  He does have a 1 x 3 cm area of swelling and induration over the right collarbone with a central punctum. There is erythema and warmth spreading down the chest to the anterior axillary line.    ED Course  Procedures (including critical care time)  Labs Review Labs Reviewed -  No data to display  Imaging Review No results found.    MDM   1. Nonvenomous insect bite of chest wall with infection, right, initial encounter    We'll cover for cellulitis with Keflex. Hydrocortisone as needed for itching. Follow-up if not improving in 2 days.    Charm Rings, MD 12/09/15 [phone]

## 2015-12-09 NOTE — Discharge Instructions (Signed)
I am concerned that the bug bite is becoming infected. Take Keflex 4 times a day for 1 week. Use the hydrocortisone cream twice a day as needed for itching. You should see improvement in the next 2 days. If things are not improving or are getting much worse, please come back or see your PCP.

## 2015-12-09 NOTE — ED Notes (Addendum)
Noticed this itchy area on left upper chest on Sunday.  As the day went on, has noticed itchiness and swelling.  Right chest is red, warm to touch, and slight puffiness.  Patient has reddish, splotchy skin at baseline, but this area seems redder.  [place] Patient thinks being [place] the farm may have been where he came in contact with insect. Patient did not see an insect, patient assumes

## 2016-01-18 NOTE — Progress Notes (Signed)
Chief Complaint  Patient presents with  . Follow-up    History of Present Illness: 55 yo WM with history of CAD, HTN, hyperlipidemia and DM who is here today for cardiac follow up. He has been followed in the past by Dr. Juanda Chance. In March of 2011 he had an anterior wall MI treated with a bare-metal stent to the LAD. His ejection fraction was 40% and improved to 60%. He has tolerated Crestor. Treadmill stress test 07/30/13 with good exercise tolerance, no ischemia.   He is here today for follow up. He has been doing well. No chest pain, SOB, palpitations, near syncope, dizziness, syncope, LE edema. He is walking daily and is exercising in the gym every day.    Primary Care Physician: Emeterio Reeve, MD  Last Lipid Profile: Followed in primary care   Past Medical History  Diagnosis Date  . Diabetes mellitus   . Hyperlipidemia   . Hypertension   . Coronary artery disease   . Upper respiratory infection   . Seasonal allergies     Past Surgical History  Procedure Laterality Date  . Appendectomy    . Laparoscopic cholecystectomy    . Coronary angioplasty with stent placement  10/2009    Current Outpatient Prescriptions  Medication Sig Dispense Refill  . aspirin EC 81 MG tablet Take 81 mg by mouth daily.    . carvedilol (COREG) 6.25 MG tablet Take 2 tablets by mouth  twice a day 360 tablet 0  . fish oil-omega-3 fatty acids 1000 MG capsule Take 1-2 g by mouth daily. 2 tabs in the am and 1 tab. In the pm    . fluticasone (FLONASE) 50 MCG/ACT nasal spray 2 sprays by Nasal route daily.     Marland Kitchen lisinopril-hydrochlorothiazide (PRINZIDE,ZESTORETIC) 20-12.5 MG per tablet Take 1 tablet by mouth daily.     Marland Kitchen NITROSTAT 0.4 MG SL tablet Dissolve 1 tablet under the tongue every 5 minutes as  needed for chest pain (do  not exceed a total of 3  doses in 15 minutes) 25 tablet 1  . rosuvastatin (CRESTOR) 10 MG tablet Take 1 tablet by mouth  every other day 45 tablet 0   No current  facility-administered medications for this visit.    Allergies  Allergen Reactions  . Simvastatin Nausea Only    Social History   Social History  . Marital Status: Single    Spouse Name: N/A  . Number of Children: N/A  . Years of Education: N/A   Occupational History  . Works at [employer].    Social History Main Topics  . Smoking status: Former Games developer  . Smokeless tobacco: Not on file  . Alcohol Use: Yes     Comment: occassional  . Drug Use: No  . Sexual Activity: Not on file   Other Topics Concern  . Not on file   Social History Narrative    Family History  Problem Relation Age of Onset  . Heart disease Neg Hx   . Heart failure Mother   . Hypertension Mother   . Hypertension Father   . Stroke Neg Hx     Review of Systems:  As stated in the HPI and otherwise negative.   BP 122/82 mmHg  Pulse 80  Ht  (1.778 m)  Wt 318 lb 12.8 oz (144.607 kg)  BMI 45.74 kg/m2  Physical Examination: General: Well developed, well nourished, NAD HEENT: OP clear, mucus membranes moist SKIN: warm, dry. No rashes. Neuro: No focal deficits Musculoskeletal:  Muscle strength 5/5 all ext Psychiatric: Mood and affect normal Neck: No JVD, no carotid bruits, no thyromegaly, no lymphadenopathy. Lungs:Clear bilaterally, no wheezes, rhonci, crackles Cardiovascular: Regular rate and rhythm. No murmurs, gallops or rubs. Abdomen:Soft. Bowel sounds present. Non-tender.  Extremities: No lower extremity edema. Pulses are 2 + in the bilateral DP/PT.  EKG:  EKG is ordered today. The ekg ordered today demonstrates NSR, rate 64 bpm. Incomplete RBBB. LAFB.   Recent Labs: No results found for requested labs within last 365 days.   Lipid Panel Followed in primary care    Wt Readings from Last 3 Encounters:  01/19/16 318 lb 12.8 oz (144.607 kg)  10/09/14 299 lb 12.8 oz (135.988 kg)  06/20/13 271 lb (122.925 kg)     Other studies Reviewed: Additional studies/ records that were  reviewed today include: . Review of the above records demonstrates:    Assessment and Plan:   1. CAD: Stable. No recent chest pains. Will continue ASA, statin, beta blocker.   2. HTN: BP is controlled. No changes today.   3. Hyperlipidemia: Continue statin. He is tolerating low dose Crestor every other day. Lipids followed in primary care.   Current medicines are reviewed at length with the patient today.  The patient does not have concerns regarding medicines.  The following changes have been made:  no change  Labs/ tests ordered today include:   Orders Placed This Encounter  Procedures  . EKG 12-Lead    Disposition:   FU with me in 12  months  Signed, Verne Carrowhristopher Benaiah Behan, MD 01/19/2016 9:36 AM    [facility] [address], [place], [place]  [address] Phone: [phone]) [phone]; Fax: [phone]) [phone]

## 2016-01-19 ENCOUNTER — Ambulatory Visit (INDEPENDENT_AMBULATORY_CARE_PROVIDER_SITE_OTHER): Payer: BLUE CROSS/BLUE SHIELD | Admitting: Cardiovascular Disease

## 2016-01-19 ENCOUNTER — Encounter: Payer: Self-pay | Admitting: Cardiovascular Disease

## 2016-01-19 VITALS — BP 122/82 | HR 80 | Ht 70.0 in | Wt 318.8 lb

## 2016-01-19 DIAGNOSIS — E785 Hyperlipidemia, unspecified: Secondary | ICD-10-CM | POA: Diagnosis not present

## 2016-01-19 DIAGNOSIS — I2510 Atherosclerotic heart disease of native coronary artery without angina pectoris: Secondary | ICD-10-CM

## 2016-01-19 DIAGNOSIS — I10 Essential (primary) hypertension: Secondary | ICD-10-CM

## 2016-01-19 NOTE — Patient Instructions (Signed)

## 2016-02-26 ENCOUNTER — Other Ambulatory Visit: Payer: Self-pay | Admitting: Cardiovascular Disease

## 2016-02-27 NOTE — Telephone Encounter (Signed)
OK to refill

## 2016-02-27 NOTE — Telephone Encounter (Signed)
Ok to refill 

## 2016-05-05 DIAGNOSIS — M898X1 Other specified disorders of bone, shoulder: Secondary | ICD-10-CM | POA: Diagnosis not present

## 2016-05-11 DIAGNOSIS — M25511 Pain in right shoulder: Secondary | ICD-10-CM | POA: Diagnosis not present

## 2016-05-11 DIAGNOSIS — S46811D Strain of other muscles, fascia and tendons at shoulder and upper arm level, right arm, subsequent encounter: Secondary | ICD-10-CM | POA: Diagnosis not present

## 2016-05-20 DIAGNOSIS — M25511 Pain in right shoulder: Secondary | ICD-10-CM | POA: Diagnosis not present

## 2016-05-20 DIAGNOSIS — S46811D Strain of other muscles, fascia and tendons at shoulder and upper arm level, right arm, subsequent encounter: Secondary | ICD-10-CM | POA: Diagnosis not present

## 2016-05-24 DIAGNOSIS — S46811D Strain of other muscles, fascia and tendons at shoulder and upper arm level, right arm, subsequent encounter: Secondary | ICD-10-CM | POA: Diagnosis not present

## 2016-05-24 DIAGNOSIS — M25511 Pain in right shoulder: Secondary | ICD-10-CM | POA: Diagnosis not present

## 2016-06-01 DIAGNOSIS — S46811D Strain of other muscles, fascia and tendons at shoulder and upper arm level, right arm, subsequent encounter: Secondary | ICD-10-CM | POA: Diagnosis not present

## 2016-06-01 DIAGNOSIS — M25511 Pain in right shoulder: Secondary | ICD-10-CM | POA: Diagnosis not present

## 2016-06-02 DIAGNOSIS — M25511 Pain in right shoulder: Secondary | ICD-10-CM | POA: Diagnosis not present

## 2016-10-27 DIAGNOSIS — M659 Synovitis and tenosynovitis, unspecified: Secondary | ICD-10-CM | POA: Diagnosis not present

## 2016-11-10 DIAGNOSIS — Z0000 Encounter for general adult medical examination without abnormal findings: Secondary | ICD-10-CM | POA: Diagnosis not present

## 2016-11-10 DIAGNOSIS — Z79899 Other long term (current) drug therapy: Secondary | ICD-10-CM | POA: Diagnosis not present

## 2016-11-10 DIAGNOSIS — M2550 Pain in unspecified joint: Secondary | ICD-10-CM | POA: Diagnosis not present

## 2016-11-10 DIAGNOSIS — E1121 Type 2 diabetes mellitus with diabetic nephropathy: Secondary | ICD-10-CM | POA: Diagnosis not present

## 2016-11-10 DIAGNOSIS — E782 Mixed hyperlipidemia: Secondary | ICD-10-CM | POA: Diagnosis not present

## 2017-02-14 DIAGNOSIS — E1165 Type 2 diabetes mellitus with hyperglycemia: Secondary | ICD-10-CM | POA: Diagnosis not present

## 2017-04-08 ENCOUNTER — Ambulatory Visit: Payer: BLUE CROSS/BLUE SHIELD | Admitting: Cardiovascular Disease

## 2017-04-15 ENCOUNTER — Encounter: Payer: Self-pay | Admitting: Cardiovascular Disease

## 2017-04-15 ENCOUNTER — Encounter (INDEPENDENT_AMBULATORY_CARE_PROVIDER_SITE_OTHER): Payer: Self-pay

## 2017-04-15 ENCOUNTER — Ambulatory Visit (INDEPENDENT_AMBULATORY_CARE_PROVIDER_SITE_OTHER): Payer: BLUE CROSS/BLUE SHIELD | Admitting: Cardiovascular Disease

## 2017-04-15 VITALS — BP 140/64 | HR 52 | Ht 70.0 in | Wt 301.0 lb

## 2017-04-15 DIAGNOSIS — I10 Essential (primary) hypertension: Secondary | ICD-10-CM

## 2017-04-15 DIAGNOSIS — I2510 Atherosclerotic heart disease of native coronary artery without angina pectoris: Secondary | ICD-10-CM | POA: Diagnosis not present

## 2017-04-15 DIAGNOSIS — E7800 Pure hypercholesterolemia, unspecified: Secondary | ICD-10-CM | POA: Diagnosis not present

## 2017-04-15 MED ORDER — ROSUVASTATIN CALCIUM 10 MG PO TABS: 10.0000 mg | ORAL_TABLET | ORAL | 3 refills | Status: DC

## 2017-04-15 MED ORDER — CARVEDILOL 12.5 MG PO TABS: 12.5000 mg | ORAL_TABLET | Freq: Two times a day (BID) | ORAL | 3 refills | Status: DC

## 2017-04-15 MED ORDER — NITROGLYCERIN 0.4 MG SL SUBL: SUBLINGUAL_TABLET | SUBLINGUAL | 1 refills | Status: AC

## 2017-04-15 MED ORDER — CARVEDILOL 12.5 MG PO TABS
12.5000 mg | ORAL_TABLET | Freq: Two times a day (BID) | ORAL | 3 refills | Status: DC
Start: 2017-04-15 — End: 2018-02-20

## 2017-04-15 NOTE — Progress Notes (Signed)
Chief Complaint  Patient presents with  . Follow-up    CAD    History of Present Illness: 56 yo WM with history of CAD, HTN, hyperlipidemia and DM who is here today for cardiac follow up. In March of 2011 he had an anterior wall MI treated with a bare-metal stent to the LAD. His ejection fraction was 40% and improved to 60%. Last echo in 2011.   He is here today for follow up. The patient denies any chest pain, dyspnea, palpitations, lower extremity edema, orthopnea, PND, dizziness, near syncope or syncope.     Primary Care Physician: [name], MD  Past Medical History:  Diagnosis Date  . Coronary artery disease   . Diabetes mellitus   . Hyperlipidemia   . Hypertension   . Seasonal allergies   . Upper respiratory infection     Past Surgical History:  Procedure Laterality Date  . APPENDECTOMY    . CORONARY ANGIOPLASTY WITH STENT PLACEMENT  10/2009  . LAPAROSCOPIC CHOLECYSTECTOMY      Current Outpatient Prescriptions  Medication Sig Dispense Refill  . aspirin EC 81 MG tablet Take 81 mg by mouth daily.    . carvedilol (COREG) 12.5 MG tablet Take 1 tablet (12.5 mg total) by mouth 2 (two) times daily with a meal. 180 tablet 3  . fish oil-omega-3 fatty acids 1000 MG capsule Take 1-2 g by mouth daily. 2 tabs in the am and 1 tab. In the pm    . fluticasone (FLONASE) 50 MCG/ACT nasal spray 2 sprays by Nasal route daily.     Marland Kitchen lisinopril-hydrochlorothiazide (PRINZIDE,ZESTORETIC) 20-12.5 MG per tablet Take 1 tablet by mouth daily.     . metFORMIN (GLUCOPHAGE) 500 MG tablet Take 500 mg by mouth 2 (two) times daily with a meal.    . nitroGLYCERIN (NITROSTAT) 0.4 MG SL tablet Dissolve 1 tablet under the tongue every 5 minutes as  needed for chest pain (do  not exceed a total of 3  doses in 15 minutes) 75 tablet 1  . rosuvastatin (CRESTOR) 10 MG tablet Take 1 tablet (10 mg total) by mouth every other day. 45 tablet 3   No current facility-administered medications for this visit.       Allergies  Allergen Reactions  . Simvastatin Nausea Only    Social History   Social History  . Marital status: Single    Spouse name: N/A  . Number of children: N/A  . Years of education: N/A   Occupational History  . Works at [employer].    Social History Main Topics  . Smoking status: Former Games developer  . Smokeless tobacco: Never Used  . Alcohol use Yes     Comment: occassional  . Drug use: No  . Sexual activity: Not on file   Other Topics Concern  . Not on file   Social History Narrative  . No narrative on file    Family History  Problem Relation Age of Onset  . Heart failure Mother   . Hypertension Mother   . Hypertension Father   . Heart disease Neg Hx   . Stroke Neg Hx     Review of Systems:  As stated in the HPI and otherwise negative.   BP 140/64   Pulse (!) 52   Ht 5\' 10"  (1.778 m)   Wt (!) 301 lb (136.5 kg)   SpO2 95%   BMI 43.19 kg/m   Physical Examination:  General: Well developed, well nourished, NAD  HEENT: OP clear,  mucus membranes moist  SKIN: warm, dry. No rashes. Neuro: No focal deficits  Musculoskeletal: Muscle strength 5/5 all ext  Psychiatric: Mood and affect normal  Neck: No JVD, no carotid bruits, no thyromegaly, no lymphadenopathy.  Lungs:Clear bilaterally, no wheezes, rhonci, crackles Cardiovascular: Regular rate and rhythm. No murmurs, gallops or rubs. Abdomen:Soft. Bowel sounds present. Non-tender.  Extremities: No lower extremity edema. Pulses are 2 + in the bilateral DP/PT.  EKG:  EKG is ordered today. The ekg ordered today demonstrates Sinus bradycardia, rate 52 bpm. LAFB. Poor R wave progression  Recent Labs: No results found for requested labs within last 8760 hours.   Lipid Panel Followed in primary care    Wt Readings from Last 3 Encounters:  04/15/17 (!) 301 lb (136.5 kg)  01/19/16 (!) 318 lb 12.8 oz (144.6 kg)  10/09/14 299 lb 12.8 oz (136 kg)     Other studies Reviewed: Additional studies/ records that  were reviewed today include: . Review of the above records demonstrates:    Assessment and Plan:   1. CAD without angina: He is having no chest pain suggestive of angina. Will continue ASA, statin and beta blocker.    2. HTN: BP is controlled. No changes today  3. Hyperlipidemia: He is tolerating low dose Crestor. Lipids followed in primary care.   Current medicines are reviewed at length with the patient today.  The patient does not have concerns regarding medicines.  The following changes have been made:  no change  Labs/ tests ordered today include:   Orders Placed This Encounter  Procedures  . EKG 12-Lead  . ECHOCARDIOGRAM COMPLETE    Disposition:   FU with me in 12  months  Signed, Verne Carrowhristopher Abednego Yeates, MD 04/15/2017 9:18 AM    [facility] [address], [place], [place]  [address] Phone: [phone](336) [phone]; Fax: [phone]

## 2017-04-15 NOTE — Patient Instructions (Signed)

## 2017-05-30 ENCOUNTER — Other Ambulatory Visit: Payer: Self-pay

## 2017-05-30 ENCOUNTER — Telehealth: Payer: Self-pay | Admitting: Cardiovascular Disease

## 2017-05-30 ENCOUNTER — Encounter (INDEPENDENT_AMBULATORY_CARE_PROVIDER_SITE_OTHER): Payer: Self-pay

## 2017-05-30 ENCOUNTER — Ambulatory Visit (HOSPITAL_COMMUNITY): Payer: BLUE CROSS/BLUE SHIELD | Attending: Cardiovascular Disease

## 2017-05-30 DIAGNOSIS — I2510 Atherosclerotic heart disease of native coronary artery without angina pectoris: Secondary | ICD-10-CM | POA: Diagnosis not present

## 2017-05-30 DIAGNOSIS — I420 Dilated cardiomyopathy: Secondary | ICD-10-CM | POA: Diagnosis not present

## 2017-05-30 NOTE — Telephone Encounter (Signed)
I spoke with pt and reviewed echo results with him.  

## 2017-05-30 NOTE — Telephone Encounter (Signed)
Follow Up:      Returning your call from today. 

## 2018-02-20 ENCOUNTER — Other Ambulatory Visit: Payer: Self-pay | Admitting: Cardiovascular Disease

## 2018-03-24 DIAGNOSIS — Z0000 Encounter for general adult medical examination without abnormal findings: Secondary | ICD-10-CM | POA: Diagnosis not present

## 2018-03-24 DIAGNOSIS — Z79899 Other long term (current) drug therapy: Secondary | ICD-10-CM | POA: Diagnosis not present

## 2018-03-24 DIAGNOSIS — Z1211 Encounter for screening for malignant neoplasm of colon: Secondary | ICD-10-CM | POA: Diagnosis not present

## 2018-03-24 DIAGNOSIS — Z1159 Encounter for screening for other viral diseases: Secondary | ICD-10-CM | POA: Diagnosis not present

## 2018-03-24 DIAGNOSIS — E1121 Type 2 diabetes mellitus with diabetic nephropathy: Secondary | ICD-10-CM | POA: Diagnosis not present

## 2018-03-24 DIAGNOSIS — E782 Mixed hyperlipidemia: Secondary | ICD-10-CM | POA: Diagnosis not present

## 2018-04-13 DIAGNOSIS — M1712 Unilateral primary osteoarthritis, left knee: Secondary | ICD-10-CM | POA: Diagnosis not present

## 2018-04-18 ENCOUNTER — Other Ambulatory Visit: Payer: Self-pay | Admitting: Cardiovascular Disease

## 2018-04-20 ENCOUNTER — Ambulatory Visit (INDEPENDENT_AMBULATORY_CARE_PROVIDER_SITE_OTHER): Payer: BLUE CROSS/BLUE SHIELD | Admitting: Cardiovascular Disease

## 2018-04-20 ENCOUNTER — Encounter: Payer: Self-pay | Admitting: Cardiovascular Disease

## 2018-04-20 VITALS — BP 118/80 | HR 57 | Ht 70.0 in | Wt 291.0 lb

## 2018-04-20 DIAGNOSIS — E7800 Pure hypercholesterolemia, unspecified: Secondary | ICD-10-CM

## 2018-04-20 DIAGNOSIS — I10 Essential (primary) hypertension: Secondary | ICD-10-CM

## 2018-04-20 DIAGNOSIS — I2510 Atherosclerotic heart disease of native coronary artery without angina pectoris: Secondary | ICD-10-CM

## 2018-04-20 NOTE — Progress Notes (Signed)
Chief Complaint  Patient presents with  . Follow-up    CAD    History of Present Illness: 57 yo male with history of CAD, HTN, hyperlipidemia and DM who is here today for cardiac follow up. In March of 2011 he had an anterior wall MI treated with a bare-metal stent in the LAD. His ejection fraction was 40% immediately post MI but improved to 60% on f/u echo. Most recent echo October 2018 with LVEF=60-65%. No valve disease.   He is here today for follow up. The patient denies any chest pain, dyspnea, palpitations, lower extremity edema, orthopnea, PND, dizziness, near syncope or syncope.     Primary Care Physician: [name], MD  Past Medical History:  Diagnosis Date  . Coronary artery disease   . Diabetes mellitus   . Hyperlipidemia   . Hypertension   . Seasonal allergies   . Upper respiratory infection     Past Surgical History:  Procedure Laterality Date  . APPENDECTOMY    . CORONARY ANGIOPLASTY WITH STENT PLACEMENT  10/2009  . LAPAROSCOPIC CHOLECYSTECTOMY      Current Outpatient Medications  Medication Sig Dispense Refill  . aspirin EC 81 MG tablet Take 81 mg by mouth daily.    . carvedilol (COREG) 12.5 MG tablet Take 1 tablet (12.5 mg total) by mouth 2 (two) times daily with a meal. Please keep upcoming appt in September with Dr. [name]. Thank you 180 tablet 0  . fish oil-omega-3 fatty acids 1000 MG capsule Take 1-2 g by mouth daily. 2 tabs in the am and 1 tab. In the pm    . fluticasone (FLONASE) 50 MCG/ACT nasal spray 2 sprays by Nasal route daily.     Marland Kitchen lisinopril-hydrochlorothiazide (PRINZIDE,ZESTORETIC) 20-12.5 MG per tablet Take 1 tablet by mouth daily.     . metFORMIN (GLUCOPHAGE) 500 MG tablet Take 500 mg by mouth 2 (two) times daily with a meal.    . nitroGLYCERIN (NITROSTAT) 0.4 MG SL tablet Dissolve 1 tablet under the tongue every 5 minutes as  needed for chest pain (do  not exceed a total of 3  doses in 15 minutes) 75 tablet 1  . rosuvastatin (CRESTOR)  10 MG tablet Take 1 tablet (10 mg total) by mouth every other day. Please keep upcoming appt in September with Dr. [name]. Thank you 45 tablet 0   No current facility-administered medications for this visit.     Allergies  Allergen Reactions  . Simvastatin Nausea Only    Social History   Socioeconomic History  . Marital status: Single    Spouse name: Not on file  . Number of children: Not on file  . Years of education: Not on file  . Highest education level: Not on file  Occupational History  . Occupation: Works at [employer].  Social Needs  . Financial resource strain: Not on file  . Food insecurity:    Worry: Not on file    Inability: Not on file  . Transportation needs:    Medical: Not on file    Non-medical: Not on file  Tobacco Use  . Smoking status: Former Games developer  . Smokeless tobacco: Never Used  Substance and Sexual Activity  . Alcohol use: Yes    Comment: occassional  . Drug use: No  . Sexual activity: Not on file  Lifestyle  . Physical activity:    Days per week: Not on file    Minutes per session: Not on file  . Stress: Not on file  Relationships  . Social connections:    Talks on phone: Not on file    Gets together: Not on file    Attends religious service: Not on file    Active member of club or organization: Not on file    Attends meetings of clubs or organizations: Not on file    Relationship status: Not on file  . Intimate partner violence:    Fear of current or ex partner: Not on file    Emotionally abused: Not on file    Physically abused: Not on file    Forced sexual activity: Not on file  Other Topics Concern  . Not on file  Social History Narrative  . Not on file    Family History  Problem Relation Age of Onset  . Heart failure Mother   . Hypertension Mother   . Hypertension Father   . Heart disease Neg Hx   . Stroke Neg Hx     Review of Systems:  As stated in the HPI and otherwise negative.   BP 118/80   Pulse (!) 57   Ht 5'  10" (1.778 m)   Wt 291 lb (132 kg)   BMI 41.75 kg/m   Physical Examination:  General: Well developed, well nourished, NAD  HEENT: OP clear, mucus membranes moist  SKIN: warm, dry. No rashes. Neuro: No focal deficits  Musculoskeletal: Muscle strength 5/5 all ext  Psychiatric: Mood and affect normal  Neck: No JVD, no carotid bruits, no thyromegaly, no lymphadenopathy.  Lungs:Clear bilaterally, no wheezes, rhonci, crackles Cardiovascular: Regular rate and rhythm. No murmurs, gallops or rubs. Abdomen:Soft. Bowel sounds present. Non-tender.  Extremities: No lower extremity edema. Pulses are 2 + in the bilateral DP/PT.  EKG:  EKG is ordered today. The ekg ordered today demonstrates Sinus brady, rate 57 bpm. LAFB. Poor R wave progression precordial leads  Recent Labs: No results found for requested labs within last 8760 hours.   Lipid Panel Followed in primary care    Wt Readings from Last 3 Encounters:  04/20/18 291 lb (132 kg)  04/15/17 (!) 301 lb (136.5 kg)  01/19/16 (!) 318 lb 12.8 oz (144.6 kg)     Other studies Reviewed: Additional studies/ records that were reviewed today include: . Review of the above records demonstrates:    Assessment and Plan:   1. CAD without angina: No chest pain. LV function normal by echo in 2018. Will continue ASA, statin and beta blocker. .    2. HTN: BP is controlled. No changes  3. Hyperlipidemia: Lipids followed in primary care. Continue statin.   Current medicines are reviewed at length with the patient today.  The patient does not have concerns regarding medicines.  The following changes have been made:  no change  Labs/ tests ordered today include:   Orders Placed This Encounter  Procedures  . EKG 12-Lead    Disposition:   FU with me in 12  months  Signed, [name], MD 04/20/2018 11:52 AM    [facility] [address] [place], [place], [place]  [address] Phone: [phone]; Fax: [phone]

## 2018-04-20 NOTE — Patient Instructions (Signed)

## 2018-06-06 DIAGNOSIS — M545 Low back pain: Secondary | ICD-10-CM | POA: Diagnosis not present

## 2018-06-21 DIAGNOSIS — M47816 Spondylosis without myelopathy or radiculopathy, lumbar region: Secondary | ICD-10-CM | POA: Diagnosis not present

## 2018-06-28 DIAGNOSIS — M47816 Spondylosis without myelopathy or radiculopathy, lumbar region: Secondary | ICD-10-CM | POA: Diagnosis not present

## 2018-06-30 DIAGNOSIS — M47816 Spondylosis without myelopathy or radiculopathy, lumbar region: Secondary | ICD-10-CM | POA: Diagnosis not present

## 2018-07-05 DIAGNOSIS — M47816 Spondylosis without myelopathy or radiculopathy, lumbar region: Secondary | ICD-10-CM | POA: Diagnosis not present

## 2018-07-07 DIAGNOSIS — M47816 Spondylosis without myelopathy or radiculopathy, lumbar region: Secondary | ICD-10-CM | POA: Diagnosis not present

## 2018-07-19 DIAGNOSIS — M47816 Spondylosis without myelopathy or radiculopathy, lumbar region: Secondary | ICD-10-CM | POA: Diagnosis not present

## 2019-02-07 DIAGNOSIS — H1131 Conjunctival hemorrhage, right eye: Secondary | ICD-10-CM | POA: Diagnosis not present

## 2019-02-07 DIAGNOSIS — H524 Presbyopia: Secondary | ICD-10-CM | POA: Diagnosis not present

## 2019-02-07 DIAGNOSIS — E119 Type 2 diabetes mellitus without complications: Secondary | ICD-10-CM | POA: Diagnosis not present

## 2019-02-07 DIAGNOSIS — E089 Diabetes mellitus due to underlying condition without complications: Secondary | ICD-10-CM | POA: Diagnosis not present

## 2019-03-08 DIAGNOSIS — Z20828 Contact with and (suspected) exposure to other viral communicable diseases: Secondary | ICD-10-CM | POA: Diagnosis not present

## 2019-03-08 DIAGNOSIS — J019 Acute sinusitis, unspecified: Secondary | ICD-10-CM | POA: Diagnosis not present

## 2019-03-09 DIAGNOSIS — Z20828 Contact with and (suspected) exposure to other viral communicable diseases: Secondary | ICD-10-CM | POA: Diagnosis not present

## 2019-03-22 DIAGNOSIS — U071 COVID-19: Secondary | ICD-10-CM | POA: Diagnosis not present

## 2019-05-18 DIAGNOSIS — E1121 Type 2 diabetes mellitus with diabetic nephropathy: Secondary | ICD-10-CM | POA: Diagnosis not present

## 2019-05-18 DIAGNOSIS — Z0000 Encounter for general adult medical examination without abnormal findings: Secondary | ICD-10-CM | POA: Diagnosis not present

## 2019-05-18 DIAGNOSIS — Z125 Encounter for screening for malignant neoplasm of prostate: Secondary | ICD-10-CM | POA: Diagnosis not present

## 2019-05-18 DIAGNOSIS — Z23 Encounter for immunization: Secondary | ICD-10-CM | POA: Diagnosis not present

## 2019-05-18 DIAGNOSIS — I10 Essential (primary) hypertension: Secondary | ICD-10-CM | POA: Diagnosis not present

## 2019-05-18 DIAGNOSIS — Z79899 Other long term (current) drug therapy: Secondary | ICD-10-CM | POA: Diagnosis not present

## 2019-05-18 DIAGNOSIS — E782 Mixed hyperlipidemia: Secondary | ICD-10-CM | POA: Diagnosis not present

## 2019-05-23 ENCOUNTER — Other Ambulatory Visit: Payer: Self-pay | Admitting: Cardiovascular Disease

## 2019-06-03 ENCOUNTER — Other Ambulatory Visit: Payer: Self-pay | Admitting: Cardiovascular Disease

## 2019-06-12 ENCOUNTER — Other Ambulatory Visit: Payer: Self-pay | Admitting: Cardiovascular Disease

## 2019-06-12 MED ORDER — ROSUVASTATIN CALCIUM 10 MG PO TABS: 10.0000 mg | ORAL_TABLET | ORAL | 0 refills | Status: DC

## 2019-06-27 NOTE — Progress Notes (Signed)
Cardiology Office Note    Date:  07/03/2019   ID:  OPAL Scott Stokes, DOB 13-Feb-1961, MRN [id number]  PCP:  [name], MD  Cardiologist: [name], MD EPS: None  Chief Complaint  Patient presents with  . Follow-up    History of Present Illness:  Scott Stokes is a 58 y.o. male with history of CAD status post anterior wall STEMI treated with BMS to the LAD 10/2009 EF 40% immediately post MI improved to 60% on follow-up echo.  Most recent echo 2018 LVEF 60 to 65%.  Also has hypertension, HLD and DM.  Patient last saw Dr. [name] 04/2018 at which time he was doing well.  Patient comes in for yearly f/u. Had gained 40 lbs last year but has since lost it. He wants to lose 60 more. On Noom diet. Working out 5 days/week some treadmill and weights.Had Covid19 in August-got it from work. Didn't require hospitalization. Denies chest pain, shortness of breath, palpitations, dizziness or presyncope. Taking care of elderly father.     Past Medical History:  Diagnosis Date  . Coronary artery disease   . Diabetes mellitus   . Hyperlipidemia   . Hypertension   . Seasonal allergies   . Upper respiratory infection     Past Surgical History:  Procedure Laterality Date  . APPENDECTOMY    . CORONARY ANGIOPLASTY WITH STENT PLACEMENT  10/2009  . LAPAROSCOPIC CHOLECYSTECTOMY      Current Medications: Current Meds  Medication Sig  . aspirin EC 81 MG tablet Take 81 mg by mouth daily.  . carvedilol (COREG) 12.5 MG tablet Take 1 tablet (12.5 mg total) by mouth 2 (two) times daily with a meal. Please keep appt on 11/17. Thank you.  . fish oil-omega-3 fatty acids 1000 MG capsule Take 1-2 g by mouth daily. 2 tabs in the am and 1 tab. In the pm  . fluticasone (FLONASE) 50 MCG/ACT nasal spray 2 sprays by Nasal route daily.   Scott Stokes lisinopril-hydrochlorothiazide (PRINZIDE,ZESTORETIC) 20-12.5 MG per tablet Take 1 tablet by mouth daily.   . metFORMIN (GLUCOPHAGE) 500 MG tablet Take 500 mg  by mouth 2 (two) times daily with a meal.  . nitroGLYCERIN (NITROSTAT) 0.4 MG SL tablet Dissolve 1 tablet under the tongue every 5 minutes as  needed for chest pain (do  not exceed a total of 3  doses in 15 minutes)  . rosuvastatin (CRESTOR) 10 MG tablet Take 1 tablet (10 mg total) by mouth every other day. Please keep upcoming appt in November before anymore refills. Thank you     Allergies:   Simvastatin   Social History   Socioeconomic History  . Marital status: Single    Spouse name: Not on file  . Number of children: Not on file  . Years of education: Not on file  . Highest education level: Not on file  Occupational History  . Occupation: Works at [employer].  Social Needs  . Financial resource strain: Not on file  . Food insecurity    Worry: Not on file    Inability: Not on file  . Transportation needs    Medical: Not on file    Non-medical: Not on file  Tobacco Use  . Smoking status: Former Games developer  . Smokeless tobacco: Never Used  Substance and Sexual Activity  . Alcohol use: Yes    Comment: occassional  . Drug use: No  . Sexual activity: Not on file  Lifestyle  . Physical activity    Days  per week: Not on file    Minutes per session: Not on file  . Stress: Not on file  Relationships  . Social Musicianconnections    Talks on phone: Not on file    Gets together: Not on file    Attends religious service: Not on file    Active member of club or organization: Not on file    Attends meetings of clubs or organizations: Not on file    Relationship status: Not on file  Other Topics Concern  . Not on file  Social History Narrative  . Not on file     Family History:  The patient's   family history includes Heart failure in his mother; Hypertension in his father and mother.   ROS:   Please see the history of present illness.    ROS All other systems reviewed and are negative.   PHYSICAL EXAM:   VS:  BP 116/76   Pulse 64   Ht 5\' 10"  (1.778 m)   Wt 285 lb (129.3 kg)    SpO2 95%   BMI 40.89 kg/m   Physical Exam  GEN: Obese, in no acute distress  Neck: no JVD, carotid bruits, or masses Cardiac:RRR; no murmurs, rubs, or gallops  Respiratory:  clear to auscultation bilaterally, normal work of breathing GI: soft, nontender, nondistended, + BS Ext: without cyanosis, clubbing, or edema, Good distal pulses bilaterally Neuro:  Alert and Oriented x 3 Psych: euthymic mood, full affect  Wt Readings from Last 3 Encounters:  07/03/19 285 lb (129.3 kg)  04/20/18 291 lb (132 kg)  04/15/17 (!) 301 lb (136.5 kg)      Studies/Labs Reviewed:   EKG:  EKG is  ordered today.  The ekg ordered today demonstrates normal sinus rhythm old septal infarct incomplete right bundle branch block  Recent Labs: No results found for requested labs within last 8760 hours.   Lipid Panel    Component Value Date/Time   CHOL 104 12/19/2009 0826   TRIG 107.0 12/19/2009 0826   HDL 31.70 (L) 12/19/2009 0826   CHOLHDL 3 12/19/2009 0826   VLDL 21.4 12/19/2009 0826   LDLCALC 51 12/19/2009 0826    Additional studies/ records that were reviewed today include:  2D echo 2018Study Conclusions   - Left ventricle: The cavity size was normal. Wall thickness was   normal. Systolic function was normal. The estimated ejection   fraction was in the range of 60% to 65%. Wall motion was normal;   there were no regional wall motion abnormalities. Left   ventricular diastolic function parameters were normal. - Left atrium: The atrium was mildly dilated. - Right atrium: The atrium was mildly dilated.   -------------------------------------------------------------------     ASSESSMENT:    1. Atherosclerosis of native coronary artery of native heart without angina pectoris   2. Essential hypertension   3. Hyperlipidemia, unspecified hyperlipidemia type   4. Morbid obesity (HCC)      PLAN:  In order of problems listed above:  CAD status post anterior wall MI 2011 treated with  bare-metal stenting to the LAD no angina.  Continue Coreg Crestor and aspirin  Essential hypertension blood pressure controlled on Zestoretic creatinine normal 05/18/2019  Hyperlipidemia LDL 65 05/18/19 AST  was slightly elevated at 74  Morbid obesity patient doing the Noom diet and exercising 5 days a week.  Has lost 40 pounds that he gained but is motivated to lose 60 more.     Medication Adjustments/Labs and Tests Ordered: Current medicines  are reviewed at length with the patient today.  Concerns regarding medicines are outlined above.  Medication changes, Labs and Tests ordered today are listed in the Patient Instructions below. Patient Instructions  Medication Instructions:  Your physician recommends that you continue on your current medications as directed. Please refer to the Current Medication list given to you today.  *If you need a refill on your cardiac medications before your next appointment, please call your pharmacy*  Lab Work: None ordered. If you have labs (blood work) drawn today and your tests are completely normal, you will receive your results only by: Scott Stokes MyChart Message (if you have MyChart) OR . A paper copy in the mail If you have any lab test that is abnormal or we need to change your treatment, we will call you to review the results.  Testing/Procedures: None ordered.  Follow-Up: At [facility], you and your health needs are our priority.  As part of our continuing mission to provide you with exceptional heart care, we have created designated Provider Care Teams.  These Care Teams include your primary Cardiologist (physician) and Advanced Practice Providers (APPs -  Physician Assistants and Nurse Practitioners) who all work together to provide you with the care you need, when you need it.  Your next appointment:   12 months  The format for your next appointment:   In Person  Provider:   You may see Lauree Chandler, MD or one of the following  Advanced Practice Providers on your designated Care Team:    Melina Copa, PA-C  Ermalinda Barrios, PA-C   Other Instructions     Signed, Ermalinda Barrios, PA-C  07/03/2019 8:23 AM    [place], [place], [place]  [address] Phone: [phone]; Fax: [phone]

## 2019-07-03 ENCOUNTER — Encounter: Payer: Self-pay | Admitting: Physician Assistant

## 2019-07-03 ENCOUNTER — Other Ambulatory Visit: Payer: Self-pay

## 2019-07-03 ENCOUNTER — Ambulatory Visit (INDEPENDENT_AMBULATORY_CARE_PROVIDER_SITE_OTHER): Payer: BC Managed Care – PPO | Admitting: Physician Assistant

## 2019-07-03 VITALS — BP 116/76 | HR 64 | Ht 70.0 in | Wt 285.0 lb

## 2019-07-03 DIAGNOSIS — E785 Hyperlipidemia, unspecified: Secondary | ICD-10-CM

## 2019-07-03 DIAGNOSIS — I10 Essential (primary) hypertension: Secondary | ICD-10-CM

## 2019-07-03 DIAGNOSIS — I2510 Atherosclerotic heart disease of native coronary artery without angina pectoris: Secondary | ICD-10-CM

## 2019-07-03 NOTE — Patient Instructions (Signed)
Medication Instructions:  Your physician recommends that you continue on your current medications as directed. Please refer to the Current Medication list given to you today.  *If you need a refill on your cardiac medications before your next appointment, please call your pharmacy*  Lab Work: None ordered  If you have labs (blood work) drawn today and your tests are completely normal, you will receive your results only by: . MyChart Message (if you have MyChart) OR . A paper copy in the mail If you have any lab test that is abnormal or we need to change your treatment, we will call you to review the results.  Testing/Procedures: None ordered  Follow-Up: At [facility], you and your health needs are our priority.  As part of our continuing mission to provide you with exceptional heart care, we have created designated Provider Care Teams.  These Care Teams include your primary Cardiologist (physician) and Advanced Practice Providers (APPs -  Physician Assistants and Nurse Practitioners) who all work together to provide you with the care you need, when you need it.  Your next appointment:   12 month(s)  The format for your next appointment:   In Person  Provider:   You may see Christopher McAlhany, MD or one of the following Advanced Practice Providers on your designated Care Team:    Dayna Dunn, PA-C  Michele Lenze, PA-C   Other Instructions   

## 2019-08-02 ENCOUNTER — Other Ambulatory Visit: Payer: Self-pay | Admitting: Cardiovascular Disease

## 2019-08-23 ENCOUNTER — Other Ambulatory Visit: Payer: Self-pay | Admitting: Cardiovascular Disease

## 2020-02-13 DIAGNOSIS — E083291 Diabetes mellitus due to underlying condition with mild nonproliferative diabetic retinopathy without macular edema, right eye: Secondary | ICD-10-CM | POA: Diagnosis not present

## 2020-02-13 DIAGNOSIS — H524 Presbyopia: Secondary | ICD-10-CM | POA: Diagnosis not present

## 2020-02-13 DIAGNOSIS — E113291 Type 2 diabetes mellitus with mild nonproliferative diabetic retinopathy without macular edema, right eye: Secondary | ICD-10-CM | POA: Diagnosis not present

## 2020-02-13 DIAGNOSIS — H04123 Dry eye syndrome of bilateral lacrimal glands: Secondary | ICD-10-CM | POA: Diagnosis not present

## 2020-04-22 ENCOUNTER — Other Ambulatory Visit: Payer: Self-pay | Admitting: Physician Assistant

## 2020-06-09 DIAGNOSIS — M79661 Pain in right lower leg: Secondary | ICD-10-CM | POA: Diagnosis not present

## 2020-06-13 DIAGNOSIS — Z125 Encounter for screening for malignant neoplasm of prostate: Secondary | ICD-10-CM | POA: Diagnosis not present

## 2020-06-13 DIAGNOSIS — Z23 Encounter for immunization: Secondary | ICD-10-CM | POA: Diagnosis not present

## 2020-06-13 DIAGNOSIS — Z79899 Other long term (current) drug therapy: Secondary | ICD-10-CM | POA: Diagnosis not present

## 2020-06-13 DIAGNOSIS — E113291 Type 2 diabetes mellitus with mild nonproliferative diabetic retinopathy without macular edema, right eye: Secondary | ICD-10-CM | POA: Diagnosis not present

## 2020-06-13 DIAGNOSIS — Z0000 Encounter for general adult medical examination without abnormal findings: Secondary | ICD-10-CM | POA: Diagnosis not present

## 2020-06-13 DIAGNOSIS — E782 Mixed hyperlipidemia: Secondary | ICD-10-CM | POA: Diagnosis not present

## 2020-06-21 ENCOUNTER — Other Ambulatory Visit: Payer: Self-pay | Admitting: Cardiovascular Disease

## 2020-07-13 ENCOUNTER — Other Ambulatory Visit: Payer: Self-pay | Admitting: Cardiovascular Disease

## 2020-09-06 ENCOUNTER — Other Ambulatory Visit: Payer: Self-pay | Admitting: Cardiovascular Disease

## 2020-09-19 DIAGNOSIS — E1165 Type 2 diabetes mellitus with hyperglycemia: Secondary | ICD-10-CM | POA: Diagnosis not present

## 2020-09-19 DIAGNOSIS — I2510 Atherosclerotic heart disease of native coronary artery without angina pectoris: Secondary | ICD-10-CM | POA: Diagnosis not present

## 2020-09-30 ENCOUNTER — Other Ambulatory Visit: Payer: Self-pay | Admitting: Cardiovascular Disease

## 2020-10-10 ENCOUNTER — Encounter: Payer: Self-pay | Admitting: Cardiovascular Disease

## 2020-10-10 ENCOUNTER — Other Ambulatory Visit: Payer: Self-pay

## 2020-10-10 ENCOUNTER — Ambulatory Visit (INDEPENDENT_AMBULATORY_CARE_PROVIDER_SITE_OTHER): Payer: BC Managed Care – PPO | Admitting: Cardiovascular Disease

## 2020-10-10 VITALS — BP 118/78 | HR 71 | Ht 70.0 in | Wt 267.8 lb

## 2020-10-10 DIAGNOSIS — I2510 Atherosclerotic heart disease of native coronary artery without angina pectoris: Secondary | ICD-10-CM

## 2020-10-10 DIAGNOSIS — E785 Hyperlipidemia, unspecified: Secondary | ICD-10-CM | POA: Diagnosis not present

## 2020-10-10 DIAGNOSIS — I10 Essential (primary) hypertension: Secondary | ICD-10-CM

## 2020-10-10 NOTE — Progress Notes (Signed)
Chief Complaint  Patient presents with  . Follow-up    CAD    History of Present Illness: 60 yo male with history of CAD, HTN, hyperlipidemia and DM who is here today for cardiac follow up. In March of 2011 he had an anterior wall MI treated with a bare-metal stent in the LAD. His ejection fraction was 40% immediately post MI but improved to 60% on f/u echo. Most recent echo October 2018 with LVEF=60-65%. No valve disease.   He is here today for follow up. The patient denies any chest pain, dyspnea, palpitations, lower extremity edema, orthopnea, PND, dizziness, near syncope or syncope.     Primary Care Physician: [name], MD  Past Medical History:  Diagnosis Date  . Coronary artery disease   . Diabetes mellitus   . Hyperlipidemia   . Hypertension   . Seasonal allergies   . Upper respiratory infection     Past Surgical History:  Procedure Laterality Date  . APPENDECTOMY    . CORONARY ANGIOPLASTY WITH STENT PLACEMENT  10/2009  . LAPAROSCOPIC CHOLECYSTECTOMY      Current Outpatient Medications  Medication Sig Dispense Refill  . aspirin EC 81 MG tablet Take 81 mg by mouth daily.    . carvedilol (COREG) 12.5 MG tablet TAKE 1 TABLET BY MOUTH  TWICE DAILY WITH A MEAL 180 tablet 3  . fish oil-omega-3 fatty acids 1000 MG capsule Take 1-2 g by mouth daily. 2 tabs in the am and 1 tab. In the pm    . fluticasone (FLONASE) 50 MCG/ACT nasal spray Place 2 sprays into the nose daily.    Marland Kitchen lisinopril-hydrochlorothiazide (PRINZIDE,ZESTORETIC) 20-12.5 MG per tablet Take 1 tablet by mouth daily.    . metFORMIN (GLUCOPHAGE) 500 MG tablet Take 500 mg by mouth 2 (two) times daily with a meal.    . nitroGLYCERIN (NITROSTAT) 0.4 MG SL tablet Dissolve 1 tablet under the tongue every 5 minutes as  needed for chest pain (do  not exceed a total of 3  doses in 15 minutes) 75 tablet 1  . rosuvastatin (CRESTOR) 10 MG tablet TAKE 1 TABLET BY MOUTH  EVERY OTHER DAY. 45 tablet 0  . OZEMPIC, 0.25 OR  0.5 MG/DOSE, 2 MG/1.5ML SOPN Inject 0.5 mg into the skin once a week.     No current facility-administered medications for this visit.    Allergies  Allergen Reactions  . Simvastatin Nausea Only    Social History   Socioeconomic History  . Marital status: Single    Spouse name: Not on file  . Number of children: Not on file  . Years of education: Not on file  . Highest education level: Not on file  Occupational History  . Occupation: Works at [employer].  Tobacco Use  . Smoking status: Former Games developer  . Smokeless tobacco: Never Used  Substance and Sexual Activity  . Alcohol use: Yes    Comment: occassional  . Drug use: No  . Sexual activity: Not on file  Other Topics Concern  . Not on file  Social History Narrative  . Not on file   Social Determinants of Health   Financial Resource Strain: Not on file  Food Insecurity: Not on file  Transportation Needs: Not on file  Physical Activity: Not on file  Stress: Not on file  Social Connections: Not on file  Intimate Partner Violence: Not on file    Family History  Problem Relation Age of Onset  . Heart failure Mother   .  Hypertension Mother   . Hypertension Father   . Heart disease Neg Hx   . Stroke Neg Hx     Review of Systems:  As stated in the HPI and otherwise negative.   BP 118/78   Pulse 71   Ht 5\' 10"  (1.778 m)   Wt 267 lb 12.8 oz (121.5 kg)   SpO2 95%   BMI 38.43 kg/m   Physical Examination:  General: Well developed, well nourished, NAD  HEENT: OP clear, mucus membranes moist  SKIN: warm, dry. No rashes. Neuro: No focal deficits  Musculoskeletal: Muscle strength 5/5 all ext  Psychiatric: Mood and affect normal  Neck: No JVD, no carotid bruits, no thyromegaly, no lymphadenopathy.  Lungs:Clear bilaterally, no wheezes, rhonci, crackles Cardiovascular: Regular rate and rhythm. No murmurs, gallops or rubs. Abdomen:Soft. Bowel sounds present. Non-tender.  Extremities: No lower extremity edema. Pulses  are 2 + in the bilateral DP/PT.  EKG:  EKG is ordered today. The ekg ordered today demonstrates sinus  Recent Labs: No results found for requested labs within last 8760 hours.   Lipid Panel Followed in primary care    Wt Readings from Last 3 Encounters:  10/10/20 267 lb 12.8 oz (121.5 kg)  07/03/19 285 lb (129.3 kg)  04/20/18 291 lb (132 kg)     Other studies Reviewed: Additional studies/ records that were reviewed today include: . Review of the above records demonstrates:    Assessment and Plan:   1. CAD without angina: Doing well. No chest pain. LV function normal by echo in 2018. Continue ASA, statin and beta blocker.   2. HTN: BP is well controlled. No changes today  3. Hyperlipidemia: Lipids followed in primary care. LDL 53 February 2022. Continue statin  Current medicines are reviewed at length with the patient today.  The patient does not have concerns regarding medicines.  The following changes have been made:  no change  Labs/ tests ordered today include:   Orders Placed This Encounter  Procedures  . EKG 12-Lead    Disposition:   FU with me in 12  months  Signed, 06-19-1981, MD 10/10/2020 8:48 AM    [facility] [address] [place], [place], [place]  [place] Phone: [phone]; Fax: [phone]

## 2020-10-10 NOTE — Patient Instructions (Signed)

## 2020-11-22 ENCOUNTER — Other Ambulatory Visit: Payer: Self-pay | Admitting: Cardiovascular Disease

## 2021-03-03 DIAGNOSIS — E089 Diabetes mellitus due to underlying condition without complications: Secondary | ICD-10-CM | POA: Diagnosis not present

## 2021-03-03 DIAGNOSIS — E119 Type 2 diabetes mellitus without complications: Secondary | ICD-10-CM | POA: Diagnosis not present

## 2021-03-03 DIAGNOSIS — J329 Chronic sinusitis, unspecified: Secondary | ICD-10-CM | POA: Diagnosis not present

## 2021-03-03 DIAGNOSIS — H524 Presbyopia: Secondary | ICD-10-CM | POA: Diagnosis not present

## 2021-03-03 DIAGNOSIS — H04123 Dry eye syndrome of bilateral lacrimal glands: Secondary | ICD-10-CM | POA: Diagnosis not present

## 2021-03-24 DIAGNOSIS — R0981 Nasal congestion: Secondary | ICD-10-CM | POA: Diagnosis not present

## 2021-03-24 DIAGNOSIS — J341 Cyst and mucocele of nose and nasal sinus: Secondary | ICD-10-CM | POA: Diagnosis not present

## 2021-03-24 DIAGNOSIS — J329 Chronic sinusitis, unspecified: Secondary | ICD-10-CM | POA: Diagnosis not present

## 2021-03-24 DIAGNOSIS — J309 Allergic rhinitis, unspecified: Secondary | ICD-10-CM | POA: Diagnosis not present

## 2021-05-27 DIAGNOSIS — R052 Subacute cough: Secondary | ICD-10-CM | POA: Diagnosis not present

## 2021-05-27 DIAGNOSIS — H1045 Other chronic allergic conjunctivitis: Secondary | ICD-10-CM | POA: Diagnosis not present

## 2021-05-27 DIAGNOSIS — J309 Allergic rhinitis, unspecified: Secondary | ICD-10-CM | POA: Diagnosis not present

## 2021-05-27 DIAGNOSIS — J300 Vasomotor rhinitis: Secondary | ICD-10-CM | POA: Diagnosis not present

## 2021-08-27 ENCOUNTER — Other Ambulatory Visit: Payer: Self-pay | Admitting: Cardiovascular Disease

## 2021-09-23 DIAGNOSIS — J300 Vasomotor rhinitis: Secondary | ICD-10-CM | POA: Diagnosis not present

## 2021-09-23 DIAGNOSIS — J4530 Mild persistent asthma, uncomplicated: Secondary | ICD-10-CM | POA: Diagnosis not present

## 2021-09-23 DIAGNOSIS — H1045 Other chronic allergic conjunctivitis: Secondary | ICD-10-CM | POA: Diagnosis not present

## 2021-10-20 ENCOUNTER — Other Ambulatory Visit: Payer: Self-pay | Admitting: Cardiovascular Disease

## 2021-10-22 DIAGNOSIS — Z79899 Other long term (current) drug therapy: Secondary | ICD-10-CM | POA: Diagnosis not present

## 2021-10-22 DIAGNOSIS — Z125 Encounter for screening for malignant neoplasm of prostate: Secondary | ICD-10-CM | POA: Diagnosis not present

## 2021-10-22 DIAGNOSIS — Z0000 Encounter for general adult medical examination without abnormal findings: Secondary | ICD-10-CM | POA: Diagnosis not present

## 2021-10-22 DIAGNOSIS — E1121 Type 2 diabetes mellitus with diabetic nephropathy: Secondary | ICD-10-CM | POA: Diagnosis not present

## 2021-11-16 ENCOUNTER — Encounter: Payer: Self-pay | Admitting: Cardiovascular Disease

## 2021-11-16 ENCOUNTER — Ambulatory Visit (INDEPENDENT_AMBULATORY_CARE_PROVIDER_SITE_OTHER): Payer: BC Managed Care – PPO | Admitting: Cardiovascular Disease

## 2021-11-16 VITALS — BP 126/82 | HR 71 | Ht 70.0 in | Wt 286.6 lb

## 2021-11-16 DIAGNOSIS — I2510 Atherosclerotic heart disease of native coronary artery without angina pectoris: Secondary | ICD-10-CM

## 2021-11-16 DIAGNOSIS — E785 Hyperlipidemia, unspecified: Secondary | ICD-10-CM | POA: Diagnosis not present

## 2021-11-16 DIAGNOSIS — I10 Essential (primary) hypertension: Secondary | ICD-10-CM | POA: Diagnosis not present

## 2021-11-16 NOTE — Progress Notes (Signed)
? ?Chief Complaint  ?Patient presents with  ? Follow-up  ?  CAD  ? ?History of Present Illness: 61 yo male with history of CAD, HTN, hyperlipidemia and DM who is here today for cardiac follow up. In March of 2011 he had an anterior wall MI treated with a bare-metal stent in the LAD. His ejection fraction was 40% immediately post MI but improved to 60% on f/u echo. Most recent echo October 2018 with LVEF=60-65%. No valve disease.  ? ?He is here today for follow up. The patient denies any chest pain, dyspnea, palpitations, lower extremity edema, orthopnea, PND, dizziness, near syncope or syncope.  ?   ?Primary Care Physician: Jonathon Jordan, MD ? ?Past Medical History:  ?Diagnosis Date  ? Coronary artery disease   ? Diabetes mellitus   ? Hyperlipidemia   ? Hypertension   ? Seasonal allergies   ? Upper respiratory infection   ? ? ?Past Surgical History:  ?Procedure Laterality Date  ? APPENDECTOMY    ? CORONARY ANGIOPLASTY WITH STENT PLACEMENT  10/2009  ? LAPAROSCOPIC CHOLECYSTECTOMY    ? ? ?Current Outpatient Medications  ?Medication Sig Dispense Refill  ? ADVAIR DISKUS 250-50 MCG/ACT AEPB Inhale 1 puff into the lungs 2 (two) times daily.    ? aspirin EC 81 MG tablet Take 81 mg by mouth daily.    ? carvedilol (COREG) 12.5 MG tablet TAKE 1 TABLET BY MOUTH  TWICE DAILY WITH A MEAL 180 tablet 0  ? fish oil-omega-3 fatty acids 1000 MG capsule Take 1-2 g by mouth daily. 2 tabs in the am and 1 tab. In the pm    ? fluticasone (FLONASE) 50 MCG/ACT nasal spray Place 2 sprays into the nose daily.    ? lisinopril-hydrochlorothiazide (PRINZIDE,ZESTORETIC) 20-12.5 MG per tablet Take 1 tablet by mouth daily.    ? metFORMIN (GLUCOPHAGE) 500 MG tablet Take 500 mg by mouth 2 (two) times daily with a meal.    ? montelukast (SINGULAIR) 10 MG tablet Take 10 mg by mouth daily.    ? nitroGLYCERIN (NITROSTAT) 0.4 MG SL tablet Dissolve 1 tablet under the tongue every 5 minutes as  needed for chest pain (do  not exceed a total of 3  doses in  15 minutes) 75 tablet 1  ? OZEMPIC, 0.25 OR 0.5 MG/DOSE, 2 MG/1.5ML SOPN Inject 0.5 mg into the skin once a week.    ? rosuvastatin (CRESTOR) 10 MG tablet TAKE 1 TABLET BY MOUTH  EVERY OTHER DAY 45 tablet 0  ? ?No current facility-administered medications for this visit.  ? ? ?Allergies  ?Allergen Reactions  ? Simvastatin Nausea Only  ? ? ?Social History  ? ?Socioeconomic History  ? Marital status: Single  ?  Spouse name: Not on file  ? Number of children: Not on file  ? Years of education: Not on file  ? Highest education level: Not on file  ?Occupational History  ? Occupation: Works at [employer].  ?Tobacco Use  ? Smoking status: Former  ? Smokeless tobacco: Never  ?Substance and Sexual Activity  ? Alcohol use: Yes  ?  Comment: occassional  ? Drug use: No  ? Sexual activity: Not on file  ?Other Topics Concern  ? Not on file  ?Social History Narrative  ? Not on file  ? ?Social Determinants of Health  ? ?Financial Resource Strain: Not on file  ?Food Insecurity: Not on file  ?Transportation Needs: Not on file  ?Physical Activity: Not on file  ?Stress: Not on file  ?  Social Connections: Not on file  ?Intimate Partner Violence: Not on file  ? ? ?Family History  ?Problem Relation Age of Onset  ? Heart failure Mother   ? Hypertension Mother   ? Hypertension Father   ? Heart disease Neg Hx   ? Stroke Neg Hx   ? ? ?Review of Systems:  As stated in the HPI and otherwise negative.  ? ?BP 126/82   Pulse 71   Ht 5\' 10"  (1.778 m)   Wt 286 lb 9.6 oz (130 kg)   SpO2 96%   BMI 41.12 kg/m?  ? ?Physical Examination: ? ?General: Well developed, well nourished, NAD  ?HEENT: OP clear, mucus membranes moist  ?SKIN: warm, dry. No rashes. ?Neuro: No focal deficits  ?Musculoskeletal: Muscle strength 5/5 all ext  ?Psychiatric: Mood and affect normal  ?Neck: No JVD, no carotid bruits, no thyromegaly, no lymphadenopathy.  ?Lungs:Clear bilaterally, no wheezes, rhonci, crackles ?Cardiovascular: Regular rate and rhythm. No murmurs, gallops or  rubs. ?Abdomen:Soft. Bowel sounds present. Non-tender.  ?Extremities: No lower extremity edema. Pulses are 2 + in the bilateral DP/PT. ? ?EKG:  EKG is ordered today. ?The ekg ordered today demonstrates Sinus ? ?Recent Labs: ?No results found for requested labs within last 8760 hours.  ? ?Lipid Panel ?Followed in primary care ? ?  ?Wt Readings from Last 3 Encounters:  ?11/16/21 286 lb 9.6 oz (130 kg)  ?10/10/20 267 lb 12.8 oz (121.5 kg)  ?07/03/19 285 lb (129.3 kg)  ?  ? ?Other studies Reviewed: ?Additional studies/ records that were reviewed today include: . ?Review of the above records demonstrates:  ? ? ?Assessment and Plan:  ? ?1. CAD without angina: He has no chest pain. Will continue ASA, beta blocker and statin.   ? ?2. HTN: BP is controlled. Continue current therapy ? ?3. Hyperlipidemia: Lipids followed in primary care. LDL near goal in March 2023. Will continue statin ? ?Current medicines are reviewed at length with the patient today.  The patient does not have concerns regarding medicines. ? ?The following changes have been made:  no change ? ?Labs/ tests ordered today include:  ? ?Orders Placed This Encounter  ?Procedures  ? EKG 12-Lead  ? ? ?Disposition:   F/U with me in 12  months ? ?Signed, ?Lauree Chandler, MD ?11/16/2021 8:38 AM    ?[facility] ?[address], [place], [place]  [address] ?Phone: [phone]; Fax: [phone]  ? ?

## 2021-11-16 NOTE — Patient Instructions (Signed)

## 2021-11-18 ENCOUNTER — Other Ambulatory Visit: Payer: Self-pay | Admitting: Cardiovascular Disease

## 2022-01-15 ENCOUNTER — Other Ambulatory Visit: Payer: Self-pay | Admitting: Cardiovascular Disease

## 2022-01-16 ENCOUNTER — Other Ambulatory Visit: Payer: Self-pay | Admitting: Cardiovascular Disease

## 2022-01-26 DIAGNOSIS — E1121 Type 2 diabetes mellitus with diabetic nephropathy: Secondary | ICD-10-CM | POA: Diagnosis not present

## 2022-02-02 DIAGNOSIS — Z1211 Encounter for screening for malignant neoplasm of colon: Secondary | ICD-10-CM | POA: Diagnosis not present

## 2022-02-02 DIAGNOSIS — K5730 Diverticulosis of large intestine without perforation or abscess without bleeding: Secondary | ICD-10-CM | POA: Diagnosis not present

## 2022-08-13 DIAGNOSIS — R42 Dizziness and giddiness: Secondary | ICD-10-CM | POA: Diagnosis not present

## 2022-08-13 DIAGNOSIS — I959 Hypotension, unspecified: Secondary | ICD-10-CM | POA: Diagnosis not present

## 2022-08-13 DIAGNOSIS — E1165 Type 2 diabetes mellitus with hyperglycemia: Secondary | ICD-10-CM | POA: Diagnosis not present

## 2022-08-17 ENCOUNTER — Encounter: Payer: Self-pay | Admitting: Cardiovascular Disease

## 2022-08-17 MED ORDER — CARVEDILOL 12.5 MG PO TABS: 12.5000 mg | ORAL_TABLET | Freq: Two times a day (BID) | ORAL | 0 refills | Status: AC

## 2022-08-17 MED ORDER — ROSUVASTATIN CALCIUM 10 MG PO TABS: 10.0000 mg | ORAL_TABLET | ORAL | 0 refills | Status: DC

## 2022-10-28 DIAGNOSIS — H25013 Cortical age-related cataract, bilateral: Secondary | ICD-10-CM | POA: Diagnosis not present

## 2022-10-28 DIAGNOSIS — H2513 Age-related nuclear cataract, bilateral: Secondary | ICD-10-CM | POA: Diagnosis not present

## 2022-10-28 DIAGNOSIS — E119 Type 2 diabetes mellitus without complications: Secondary | ICD-10-CM | POA: Diagnosis not present

## 2022-10-29 ENCOUNTER — Other Ambulatory Visit: Payer: Self-pay | Admitting: Cardiovascular Disease

## 2022-11-02 DIAGNOSIS — M1811 Unilateral primary osteoarthritis of first carpometacarpal joint, right hand: Secondary | ICD-10-CM | POA: Diagnosis not present

## 2022-12-07 DIAGNOSIS — M1811 Unilateral primary osteoarthritis of first carpometacarpal joint, right hand: Secondary | ICD-10-CM | POA: Diagnosis not present

## 2022-12-08 DIAGNOSIS — Z0000 Encounter for general adult medical examination without abnormal findings: Secondary | ICD-10-CM | POA: Diagnosis not present

## 2022-12-08 DIAGNOSIS — Z125 Encounter for screening for malignant neoplasm of prostate: Secondary | ICD-10-CM | POA: Diagnosis not present

## 2022-12-08 DIAGNOSIS — E1121 Type 2 diabetes mellitus with diabetic nephropathy: Secondary | ICD-10-CM | POA: Diagnosis not present

## 2022-12-08 DIAGNOSIS — Z79899 Other long term (current) drug therapy: Secondary | ICD-10-CM | POA: Diagnosis not present

## 2022-12-08 DIAGNOSIS — E1165 Type 2 diabetes mellitus with hyperglycemia: Secondary | ICD-10-CM | POA: Diagnosis not present

## 2022-12-13 ENCOUNTER — Encounter: Payer: Self-pay | Admitting: Physician Assistant

## 2022-12-13 ENCOUNTER — Ambulatory Visit: Payer: BC Managed Care – PPO | Attending: Physician Assistant | Admitting: Physician Assistant

## 2022-12-13 VITALS — BP 116/77 | HR 74 | Ht 70.0 in | Wt 264.8 lb

## 2022-12-13 DIAGNOSIS — E785 Hyperlipidemia, unspecified: Secondary | ICD-10-CM

## 2022-12-13 DIAGNOSIS — I2510 Atherosclerotic heart disease of native coronary artery without angina pectoris: Secondary | ICD-10-CM | POA: Diagnosis not present

## 2022-12-13 DIAGNOSIS — I10 Essential (primary) hypertension: Secondary | ICD-10-CM | POA: Diagnosis not present

## 2022-12-13 NOTE — Progress Notes (Signed)
Office Visit    Patient Name: Scott Stokes Date of Encounter: 12/13/2022  PCP:  [name], MD   [facility]  Cardiologist:  [name], MD  Advanced Practice Provider:  No care team member to display Electrophysiologist:  None   HPI    Scott Stokes is a 62 y.o. male with a PMH of CAD, HTN, and hyperlipidemia and DM presents today for follow-up.  In March 2011 he had an anterior wall MI treated with bare-metal stent in the LAD. His ejection fraction was 40% immediately post MI but improved to 60% on f/u echo. Most recent echo October 2018 with LVEF 60-65%, no valve disease.  He tells me he was on Ozempic and now on Rybelsic .  At 1 point his blood pressure was 90/48 but his lisinopril was decreased to 10 mg and has been running much better.  A1c is 8 which is above goal.  He is working on getting this lower.  He does try to golf regularly as well as ride his lawnmower.  He has not had any swelling in his legs.  No shortness of breath other than allergies.  No chest pain.  Reports no shortness of breath nor dyspnea on exertion. Reports no chest pain, pressure, or tightness. No edema, orthopnea, PND. Reports no palpitations.   Past Medical History    Past Medical History:  Diagnosis Date   Coronary artery disease    Diabetes mellitus    Hyperlipidemia    Hypertension    Seasonal allergies    Upper respiratory infection    Past Surgical History:  Procedure Laterality Date   APPENDECTOMY     CORONARY ANGIOPLASTY WITH STENT PLACEMENT  10/2009   LAPAROSCOPIC CHOLECYSTECTOMY      Allergies  Allergies  Allergen Reactions   Simvastatin Nausea Only     EKGs/Labs/Other Studies Reviewed:   The following studies were reviewed today: Cardiac Studies & Procedures       ECHOCARDIOGRAM  ECHOCARDIOGRAM COMPLETE 05/30/2017  Narrative *[facility]* [address] [place], [place]  [address] [phone]  ------------------------------------------------------------------- Transthoracic Echocardiography  Patient:    Scott, Stokes MR #:       [id number] Study Date: 05/30/2017 Gender:     M Age:        56 Height:     177.8 cm Weight:     136.5 kg BSA:        2.66 m^2 Pt. Status: Room:  SONOGRAPHER  Aida Raider, RDCS ATTENDING    Teresita Madura, Christopher REFERRING    McAlhany, Christopher PERFORMING   [facility], Outpatient  cc:  ------------------------------------------------------------------- LV EF: 60% -   65%  ------------------------------------------------------------------- Indications:      I25.10 Coronary artery disease.  ------------------------------------------------------------------- History:   PMH:  Acquired from the patient and from the patient&'s chart.  PMH:  Coronary artery disease.  Risk factors: Hypertension. Diabetes mellitus. Obese. Dyslipidemia.  ------------------------------------------------------------------- Study Conclusions  - Left ventricle: The cavity size was normal. Wall thickness was normal. Systolic function was normal. The estimated ejection fraction was in the range of 60% to 65%. Wall motion was normal; there were no regional wall motion abnormalities. Left ventricular diastolic function parameters were normal. - Left atrium: The atrium was mildly dilated. - Right atrium: The atrium was mildly dilated.  ------------------------------------------------------------------- Study data:   Study status:  Routine.  Procedure:  The patient reported no pain pre or post test.  Transthoracic echocardiography for left ventricular function evaluation, for right ventricular function evaluation, and for assessment of valvular function. Image quality was adequate.  Study completion:  There were no complications.          Transthoracic echocardiography.  M-mode, complete 2D, spectral Doppler,  and color Doppler.  Birthdate: Patient birthdate: 12/14/1960.  Age:  Patient is 62 yr old.  Sex: Gender: male.    BMI: 43.2 kg/m^2.  Patient status:  Outpatient. Study date:  Study date: 05/30/2017. Study time: 07:50 AM. Location:  [facility]  -------------------------------------------------------------------  ------------------------------------------------------------------- Left ventricle:  The cavity size was normal. Wall thickness was normal. Systolic function was normal. The estimated ejection fraction was in the range of 60% to 65%. Wall motion was normal; there were no regional wall motion abnormalities. Left ventricular diastolic function parameters were normal.  ------------------------------------------------------------------- Aortic valve:   Structurally normal valve.   Cusp separation was normal.  Doppler:  Transvalvular velocity was within the normal range. There was no stenosis. There was no regurgitation.  ------------------------------------------------------------------- Aorta:  Aortic root: The aortic root was normal in size. Ascending aorta: The ascending aorta was normal in size.  ------------------------------------------------------------------- Mitral valve:   Structurally normal valve.   Leaflet separation was normal.  Doppler:  Transvalvular velocity was within the normal range. There was no evidence for stenosis. There was no regurgitation.    Peak gradient (D): 4 mm Hg.  ------------------------------------------------------------------- Left atrium:  The atrium was mildly dilated.  ------------------------------------------------------------------- Right ventricle:  The cavity size was normal. Systolic function was normal.  ------------------------------------------------------------------- Pulmonic valve:    Structurally normal valve.   Cusp separation was normal.  Doppler:  Transvalvular velocity was within the normal range. There was no  regurgitation.  ------------------------------------------------------------------- Tricuspid valve:   Structurally normal valve.   Leaflet separation was normal.  Doppler:  Transvalvular velocity was within the normal range. There was no regurgitation.  ------------------------------------------------------------------- Right atrium:  The atrium was mildly dilated.  ------------------------------------------------------------------- Pericardium:  There was no pericardial effusion.  ------------------------------------------------------------------- Systemic veins: Inferior vena cava: The vessel was normal in size. The respirophasic diameter changes were in the normal range (>= 50%), consistent with normal central venous pressure.  ------------------------------------------------------------------- Measurements  Left ventricle                         Value        Reference LV ID, ED, PLAX chordal        (H)     60.4  mm     43 - 52 LV ID, ES, PLAX chordal                30.1  mm     23 - 38 LV fx shortening, PLAX chordal         50    %      >=29 LV PW thickness, ED                    11.8  mm     --------- IVS/LV PW ratio, ED                    0.66         <=1.3 Stroke volume, 2D                      78    ml     --------- Stroke volume/bsa, 2D  29    ml/m^2 --------- LV e&', lateral                         12.3  cm/s   --------- LV E/e&', lateral                       8.21         --------- LV e&', medial                          8.6   cm/s   --------- LV E/e&', medial                        11.74        --------- LV e&', average                         10.45 cm/s   --------- LV E/e&', average                       9.67         ---------  Ventricular septum                     Value        Reference IVS thickness, ED                      7.74  mm     ---------  LVOT                                   Value        Reference LVOT ID, S                              22    mm     --------- LVOT area                              3.8   cm^2   --------- LVOT ID                                22    mm     --------- LVOT peak velocity, S                  94.4  cm/s   --------- LVOT mean velocity, S                  63.4  cm/s   --------- LVOT VTI, S                            20.5  cm     --------- LVOT peak gradient, S                  4     mm Hg  --------- Stroke volume (SV), LVOT DP            77.9  ml     --------- Stroke index (SV/bsa), LVOT DP  29.3  ml/m^2 ---------  Aorta                                  Value        Reference Aortic root ID, ED                     41    mm     --------- Ascending aorta ID, A-P, S             32    mm     ---------  Left atrium                            Value        Reference LA ID, A-P, ES                         44    mm     --------- LA ID/bsa, A-P                         1.65  cm/m^2 <=2.2 LA volume, S                           89    ml     --------- LA volume/bsa, S                       33.4  ml/m^2 --------- LA volume, ES, 1-p A4C                 90    ml     --------- LA volume/bsa, ES, 1-p A4C             33.8  ml/m^2 --------- LA volume, ES, 1-p A2C                 82    ml     --------- LA volume/bsa, ES, 1-p A2C             30.8  ml/m^2 ---------  Mitral valve                           Value        Reference Mitral E-wave peak velocity            101   cm/s   --------- Mitral A-wave peak velocity            93.8  cm/s   --------- Mitral deceleration time               218   ms     150 - 230 Mitral peak gradient, D                4     mm Hg  --------- Mitral E/A ratio, peak                 1.1          ---------  Right ventricle                        Value        Reference RV s&', lateral, S  12.8  cm/s   ---------  Legend: (L)  and  (H)  mark values outside specified reference range.  ------------------------------------------------------------------- Prepared  and Electronically Authenticated by  Kristeen Miss, M.D. 2018-10-15T11:31:31              EKG:  EKG is ordered today.  Normal sinus rhythm, rate 74 bpm.  Consistent with previous EKGs.  Recent Labs: No results found for requested labs within last 365 days.  Recent Lipid Panel    Component Value Date/Time   CHOL 104 12/19/2009 0826   TRIG 107.0 12/19/2009 0826   HDL 31.70 (L) 12/19/2009 0826   CHOLHDL 3 12/19/2009 0826   VLDL 21.4 12/19/2009 0826   LDLCALC 51 12/19/2009 0826   Home Medications   Current Meds  Medication Sig   ADVAIR DISKUS 250-50 MCG/ACT AEPB Inhale 1 puff into the lungs 2 (two) times daily.   aspirin EC 81 MG tablet Take 81 mg by mouth daily.   carvedilol (COREG) 12.5 MG tablet Take 1 tablet (12.5 mg total) by mouth 2 (two) times daily with a meal.   fish oil-omega-3 fatty acids 1000 MG capsule Take 1-2 g by mouth daily. 2 tabs in the am and 1 tab. In the pm   fluticasone (FLONASE) 50 MCG/ACT nasal spray Place 2 sprays into the nose daily.   lisinopril-hydrochlorothiazide (ZESTORETIC) 10-12.5 MG tablet Take 1 tablet by mouth daily.   montelukast (SINGULAIR) 10 MG tablet Take 10 mg by mouth daily.   nitroGLYCERIN (NITROSTAT) 0.4 MG SL tablet Dissolve 1 tablet under the tongue every 5 minutes as  needed for chest pain (do  not exceed a total of 3  doses in 15 minutes)   rosuvastatin (CRESTOR) 10 MG tablet Take 1 tablet by mouth every other day.   RYBELSUS 14 MG TABS Take 14 mg by mouth daily.     Review of Systems      All other systems reviewed and are otherwise negative except as noted above.  Physical Exam    VS:  BP 116/77   Pulse 74   Ht 5\' 10"  (1.778 m)   Wt 264 lb 12.8 oz (120.1 kg)   SpO2 96%   BMI 37.99 kg/m  , BMI Body mass index is 37.99 kg/m.  Wt Readings from Last 3 Encounters:  12/13/22 264 lb 12.8 oz (120.1 kg)  11/16/21 286 lb 9.6 oz (130 kg)  10/10/20 267 lb 12.8 oz (121.5 kg)     GEN: Well nourished, well developed, in no  acute distress. HEENT: normal. Neck: Supple, no JVD, carotid bruits, or masses. Cardiac: RRR, no murmurs, rubs, or gallops. No clubbing, cyanosis, edema.  Radials/PT 2+ and equal bilaterally.  Respiratory:  Respirations regular and unlabored, clear to auscultation bilaterally. GI: Soft, nontender, nondistended. MS: No deformity or atrophy. Skin: Warm and dry, no rash. Neuro:  Strength and sensation are intact. Psych: Normal affect.  Assessment & Plan    CAD without angina -No chest pain or shortness of breath (outside of allergies) -Plan to continue aspirin 81 mg, beta-blocker and statin  Hypertension -Blood pressure better controlled on lower dose of lisinopril, continue -Continue low-sodium, heart healthy diet -Continue to monitor blood pressure at home  Hyperlipidemia -Most recent lipid panel showed LDL 68 (12/04/2022) -Plan to repeat lipid panel in a year -Continue Crestor 10 mg daily and fish oil 1 to 2 g daily         Disposition: Follow up 1 year with [name], MD or APP.  Signed, Sharlene Dory, PA-C 12/13/2022, 3:49 PM [facility]

## 2022-12-13 NOTE — Patient Instructions (Signed)
Medication Instructions:   Your physician recommends that you continue on your current medications as directed. Please refer to the Current Medication list given to you today.   *If you need a refill on your cardiac medications before your next appointment, please call your pharmacy*   Lab Work: NONE ORDERED  TODAY    If you have labs (blood work) drawn today and your tests are completely normal, you will receive your results only by: MyChart Message (if you have MyChart) OR A paper copy in the mail If you have any lab test that is abnormal or we need to change your treatment, we will call you to review the results.   Testing/Procedures: NONE ORDERED  TODAY      Follow-Up:  At [facility], you and your health needs are our priority.  As part of our continuing mission to provide you with exceptional heart care, we have created designated Provider Care Teams.  These Care Teams include your primary Cardiologist (physician) and Advanced Practice Providers (APPs -  Physician Assistants and Nurse Practitioners) who all work together to provide you with the care you need, when you need it.  We recommend signing up for the patient portal called "MyChart".  Sign up information is provided on this After Visit Summary.  MyChart is used to connect with patients for Virtual Visits (Telemedicine).  Patients are able to view lab/test results, encounter notes, upcoming appointments, etc.  Non-urgent messages can be sent to your provider as well.   To learn more about what you can do with MyChart, go to [email or web address].     Your next appointment:    1 year(s)  Provider:   [name], MD

## 2022-12-22 ENCOUNTER — Other Ambulatory Visit: Payer: Self-pay | Admitting: Cardiovascular Disease

## 2023-01-27 DIAGNOSIS — M9902 Segmental and somatic dysfunction of thoracic region: Secondary | ICD-10-CM | POA: Diagnosis not present

## 2023-01-27 DIAGNOSIS — M9901 Segmental and somatic dysfunction of cervical region: Secondary | ICD-10-CM | POA: Diagnosis not present

## 2023-01-27 DIAGNOSIS — M62838 Other muscle spasm: Secondary | ICD-10-CM | POA: Diagnosis not present

## 2023-01-27 DIAGNOSIS — M542 Cervicalgia: Secondary | ICD-10-CM | POA: Diagnosis not present

## 2023-02-03 DIAGNOSIS — M9902 Segmental and somatic dysfunction of thoracic region: Secondary | ICD-10-CM | POA: Diagnosis not present

## 2023-02-03 DIAGNOSIS — M542 Cervicalgia: Secondary | ICD-10-CM | POA: Diagnosis not present

## 2023-02-03 DIAGNOSIS — M9901 Segmental and somatic dysfunction of cervical region: Secondary | ICD-10-CM | POA: Diagnosis not present

## 2023-02-03 DIAGNOSIS — M62838 Other muscle spasm: Secondary | ICD-10-CM | POA: Diagnosis not present

## 2023-02-07 DIAGNOSIS — M62838 Other muscle spasm: Secondary | ICD-10-CM | POA: Diagnosis not present

## 2023-02-07 DIAGNOSIS — M9902 Segmental and somatic dysfunction of thoracic region: Secondary | ICD-10-CM | POA: Diagnosis not present

## 2023-02-07 DIAGNOSIS — M542 Cervicalgia: Secondary | ICD-10-CM | POA: Diagnosis not present

## 2023-02-07 DIAGNOSIS — M9901 Segmental and somatic dysfunction of cervical region: Secondary | ICD-10-CM | POA: Diagnosis not present

## 2023-02-09 DIAGNOSIS — M9901 Segmental and somatic dysfunction of cervical region: Secondary | ICD-10-CM | POA: Diagnosis not present

## 2023-02-09 DIAGNOSIS — M62838 Other muscle spasm: Secondary | ICD-10-CM | POA: Diagnosis not present

## 2023-02-09 DIAGNOSIS — M542 Cervicalgia: Secondary | ICD-10-CM | POA: Diagnosis not present

## 2023-02-09 DIAGNOSIS — M9902 Segmental and somatic dysfunction of thoracic region: Secondary | ICD-10-CM | POA: Diagnosis not present

## 2023-02-10 DIAGNOSIS — M62838 Other muscle spasm: Secondary | ICD-10-CM | POA: Diagnosis not present

## 2023-02-10 DIAGNOSIS — M542 Cervicalgia: Secondary | ICD-10-CM | POA: Diagnosis not present

## 2023-02-10 DIAGNOSIS — M9902 Segmental and somatic dysfunction of thoracic region: Secondary | ICD-10-CM | POA: Diagnosis not present

## 2023-02-10 DIAGNOSIS — M9901 Segmental and somatic dysfunction of cervical region: Secondary | ICD-10-CM | POA: Diagnosis not present

## 2023-02-14 DIAGNOSIS — M62838 Other muscle spasm: Secondary | ICD-10-CM | POA: Diagnosis not present

## 2023-02-14 DIAGNOSIS — M9902 Segmental and somatic dysfunction of thoracic region: Secondary | ICD-10-CM | POA: Diagnosis not present

## 2023-02-14 DIAGNOSIS — M9901 Segmental and somatic dysfunction of cervical region: Secondary | ICD-10-CM | POA: Diagnosis not present

## 2023-02-14 DIAGNOSIS — M542 Cervicalgia: Secondary | ICD-10-CM | POA: Diagnosis not present

## 2023-02-15 DIAGNOSIS — M9902 Segmental and somatic dysfunction of thoracic region: Secondary | ICD-10-CM | POA: Diagnosis not present

## 2023-02-15 DIAGNOSIS — M62838 Other muscle spasm: Secondary | ICD-10-CM | POA: Diagnosis not present

## 2023-02-15 DIAGNOSIS — M9901 Segmental and somatic dysfunction of cervical region: Secondary | ICD-10-CM | POA: Diagnosis not present

## 2023-02-15 DIAGNOSIS — M542 Cervicalgia: Secondary | ICD-10-CM | POA: Diagnosis not present

## 2023-02-16 DIAGNOSIS — M542 Cervicalgia: Secondary | ICD-10-CM | POA: Diagnosis not present

## 2023-02-16 DIAGNOSIS — M9901 Segmental and somatic dysfunction of cervical region: Secondary | ICD-10-CM | POA: Diagnosis not present

## 2023-02-16 DIAGNOSIS — M62838 Other muscle spasm: Secondary | ICD-10-CM | POA: Diagnosis not present

## 2023-02-16 DIAGNOSIS — M9902 Segmental and somatic dysfunction of thoracic region: Secondary | ICD-10-CM | POA: Diagnosis not present

## 2023-02-21 DIAGNOSIS — M62830 Muscle spasm of back: Secondary | ICD-10-CM | POA: Diagnosis not present

## 2023-02-21 DIAGNOSIS — M62838 Other muscle spasm: Secondary | ICD-10-CM | POA: Diagnosis not present

## 2023-02-21 DIAGNOSIS — M9901 Segmental and somatic dysfunction of cervical region: Secondary | ICD-10-CM | POA: Diagnosis not present

## 2023-02-21 DIAGNOSIS — M9902 Segmental and somatic dysfunction of thoracic region: Secondary | ICD-10-CM | POA: Diagnosis not present

## 2023-02-21 DIAGNOSIS — M542 Cervicalgia: Secondary | ICD-10-CM | POA: Diagnosis not present

## 2023-02-28 DIAGNOSIS — L814 Other melanin hyperpigmentation: Secondary | ICD-10-CM | POA: Diagnosis not present

## 2023-02-28 DIAGNOSIS — L570 Actinic keratosis: Secondary | ICD-10-CM | POA: Diagnosis not present

## 2023-02-28 DIAGNOSIS — L821 Other seborrheic keratosis: Secondary | ICD-10-CM | POA: Diagnosis not present

## 2023-02-28 DIAGNOSIS — C44222 Squamous cell carcinoma of skin of right ear and external auricular canal: Secondary | ICD-10-CM | POA: Diagnosis not present

## 2023-02-28 DIAGNOSIS — D225 Melanocytic nevi of trunk: Secondary | ICD-10-CM | POA: Diagnosis not present

## 2023-02-28 DIAGNOSIS — D485 Neoplasm of uncertain behavior of skin: Secondary | ICD-10-CM | POA: Diagnosis not present

## 2023-04-27 DIAGNOSIS — J300 Vasomotor rhinitis: Secondary | ICD-10-CM | POA: Diagnosis not present

## 2023-04-27 DIAGNOSIS — J4530 Mild persistent asthma, uncomplicated: Secondary | ICD-10-CM | POA: Diagnosis not present

## 2023-04-27 DIAGNOSIS — H1045 Other chronic allergic conjunctivitis: Secondary | ICD-10-CM | POA: Diagnosis not present

## 2023-05-02 DIAGNOSIS — D0421 Carcinoma in situ of skin of right ear and external auricular canal: Secondary | ICD-10-CM | POA: Diagnosis not present

## 2024-02-23 ENCOUNTER — Other Ambulatory Visit: Payer: Self-pay | Admitting: Cardiovascular Disease

## 2024-03-21 NOTE — Progress Notes (Unsigned)
 No chief complaint on file.  History of Present Illness: 63 yo male with history of CAD, HTN, hyperlipidemia and DM who is here today for cardiac follow up. In March of 2011 he had an anterior wall MI treated with a bare-metal stent in the LAD. His ejection fraction was 40% immediately post MI but improved to 60% on f/u echo. Most recent echo October 2018 with LVEF=60-65%. No valve disease.   He is here today for follow up. The patient denies any chest pain, dyspnea, palpitations, lower extremity edema, orthopnea, PND, dizziness, near syncope or syncope.     Primary Care Physician: Verena Mems, MD  Past Medical History:  Diagnosis Date   Coronary artery disease    Diabetes mellitus    Hyperlipidemia    Hypertension    Seasonal allergies    Upper respiratory infection     Past Surgical History:  Procedure Laterality Date   APPENDECTOMY     CORONARY ANGIOPLASTY WITH STENT PLACEMENT  10/2009   LAPAROSCOPIC CHOLECYSTECTOMY      Current Outpatient Medications  Medication Sig Dispense Refill   ADVAIR DISKUS 250-50 MCG/ACT AEPB Inhale 1 puff into the lungs 2 (two) times daily.     aspirin EC 81 MG tablet Take 81 mg by mouth daily.     carvedilol  (COREG ) 12.5 MG tablet Take 1 tablet (12.5 mg total) by mouth 2 (two) times daily with a meal. 180 tablet 0   fish oil-omega-3 fatty acids 1000 MG capsule Take 1-2 g by mouth daily. 2 tabs in the am and 1 tab. In the pm     fluticasone (FLONASE) 50 MCG/ACT nasal spray Place 2 sprays into the nose daily.     lisinopril-hydrochlorothiazide (ZESTORETIC) 10-12.5 MG tablet Take 1 tablet by mouth daily.     montelukast (SINGULAIR) 10 MG tablet Take 10 mg by mouth daily.     nitroGLYCERIN  (NITROSTAT ) 0.4 MG SL tablet Dissolve 1 tablet under the tongue every 5 minutes as  needed for chest pain (do  not exceed a total of 3  doses in 15 minutes) 75 tablet 1   rosuvastatin  (CRESTOR ) 10 MG tablet Take 1 tablet by mouth every other day. 90 tablet 2    RYBELSUS 14 MG TABS Take 14 mg by mouth daily.     No current facility-administered medications for this visit.    Allergies  Allergen Reactions   Simvastatin Nausea Only    Social History   Socioeconomic History   Marital status: Single    Spouse name: Not on file   Number of children: Not on file   Years of education: Not on file   Highest education level: Not on file  Occupational History   Occupation: Works at [employer].  Tobacco Use   Smoking status: Former   Smokeless tobacco: Never  Substance and Sexual Activity   Alcohol use: Yes    Comment: occassional   Drug use: No   Sexual activity: Not on file  Other Topics Concern   Not on file  Social History Narrative   Not on file   Social Drivers of Health   Financial Resource Strain: Not on file  Food Insecurity: Not on file  Transportation Needs: Not on file  Physical Activity: Not on file  Stress: Not on file  Social Connections: Not on file  Intimate Partner Violence: Not on file    Family History  Problem Relation Age of Onset   Heart failure Mother    Hypertension Mother  Hypertension Father    Heart disease Neg Hx    Stroke Neg Hx     Review of Systems:  As stated in the HPI and otherwise negative.   There were no vitals taken for this visit.  Physical Examination:  General: Well developed, well nourished, NAD  HEENT: OP clear, mucus membranes moist  SKIN: warm, dry. No rashes. Neuro: No focal deficits  Musculoskeletal: Muscle strength 5/5 all ext  Psychiatric: Mood and affect normal  Neck: No JVD, no carotid bruits, no thyromegaly, no lymphadenopathy.  Lungs:Clear bilaterally, no wheezes, rhonci, crackles Cardiovascular: Regular rate and rhythm. No murmurs, gallops or rubs. Abdomen:Soft. Bowel sounds present. Non-tender.  Extremities: No lower extremity edema. Pulses are 2 + in the bilateral DP/PT.  EKG:  EKG is *** ordered today. The ekg ordered today demonstrates   Recent Labs: No  results found for requested labs within last 365 days.   Lipid Panel Followed in primary care    Wt Readings from Last 3 Encounters:  12/13/22 264 lb 12.8 oz (120.1 kg)  11/16/21 286 lb 9.6 oz (130 kg)  10/10/20 267 lb 12.8 oz (121.5 kg)    Assessment and Plan:   1. CAD without angina: No chest pain. Continue ASA, statin and beta blocker.     2. HTN: BP is well controlled. Continue current therapy  3. Hyperlipidemia: Lipids followed in primary care. LDL ***. Continue statin  Labs/ tests ordered today include:  No orders of the defined types were placed in this encounter.  Disposition:   F/U with me in 12  months  Signed, Lonni Cash, MD 03/21/2024 3:11 PM    [facility] [address] [place], [place], [place]  [address] Phone: [phone]; Fax: [phone]

## 2024-03-22 ENCOUNTER — Encounter: Payer: Self-pay | Admitting: Cardiovascular Disease

## 2024-03-22 ENCOUNTER — Ambulatory Visit: Attending: Cardiovascular Disease | Admitting: Cardiovascular Disease

## 2024-03-22 VITALS — BP 130/80 | HR 68 | Resp 16 | Ht 70.0 in | Wt 268.8 lb

## 2024-03-22 DIAGNOSIS — I10 Essential (primary) hypertension: Secondary | ICD-10-CM | POA: Diagnosis not present

## 2024-03-22 DIAGNOSIS — I2510 Atherosclerotic heart disease of native coronary artery without angina pectoris: Secondary | ICD-10-CM | POA: Diagnosis not present

## 2024-03-22 DIAGNOSIS — E785 Hyperlipidemia, unspecified: Secondary | ICD-10-CM | POA: Diagnosis not present

## 2024-03-22 NOTE — Patient Instructions (Signed)
 Medication Instructions:  No changes *If you need a refill on your cardiac medications before your next appointment, please call your pharmacy*  Lab Work: none  Testing/Procedures: Your physician has requested that you have an echocardiogram. Echocardiography is a painless test that uses sound waves to create images of your heart. It provides your doctor with information about the size and shape of your heart and how well your heart's chambers and valves are working. This procedure takes approximately one hour. There are no restrictions for this procedure. Please do NOT wear cologne, perfume, aftershave, or lotions (deodorant is allowed). Please arrive 15 minutes prior to your appointment time.  Please note: We ask at that you not bring children with you during ultrasound (echo/ vascular) testing. Due to room size and safety concerns, children are not allowed in the ultrasound rooms during exams. Our front office staff cannot provide observation of children in our lobby area while testing is being conducted. An adult accompanying a patient to their appointment will only be allowed in the ultrasound room at the discretion of the ultrasound technician under special circumstances. We apologize for any inconvenience.   Follow-Up: At [facility], you and your health needs are our priority.  As part of our continuing mission to provide you with exceptional heart care, our providers are all part of one team.  This team includes your primary Cardiologist (physician) and Advanced Practice Providers or APPs (Physician Assistants and Nurse Practitioners) who all work together to provide you with the care you need, when you need it.  Your next appointment:   12 month(s)  Provider:   Lonni Cash, MD

## 2024-04-26 ENCOUNTER — Ambulatory Visit (HOSPITAL_COMMUNITY)
Admission: RE | Admit: 2024-04-26 | Discharge: 2024-04-26 | Disposition: A | Discharge status: Home / Self Care | Source: Ambulatory Visit | Attending: Cardiology | Admitting: Cardiology

## 2024-04-26 DIAGNOSIS — I2510 Atherosclerotic heart disease of native coronary artery without angina pectoris: Secondary | ICD-10-CM | POA: Insufficient documentation

## 2024-04-26 LAB — ECHOCARDIOGRAM COMPLETE
Area-P 1/2: 3.02 cm2
S' Lateral: 3.5 cm

## 2024-04-27 ENCOUNTER — Telehealth: Payer: Self-pay | Admitting: Cardiovascular Disease

## 2024-04-27 ENCOUNTER — Other Ambulatory Visit: Payer: Self-pay | Admitting: *Deleted

## 2024-04-27 ENCOUNTER — Ambulatory Visit: Payer: Self-pay | Admitting: Cardiovascular Disease

## 2024-04-27 DIAGNOSIS — Z01812 Encounter for preprocedural laboratory examination: Secondary | ICD-10-CM

## 2024-04-27 DIAGNOSIS — I77810 Thoracic aortic ectasia: Secondary | ICD-10-CM

## 2024-04-27 NOTE — Telephone Encounter (Signed)
 Pt returning nurse's call regarding ECHO results

## 2024-04-27 NOTE — Telephone Encounter (Signed)
 I spoke with patient and reviewed echo results with him.  He will have CTA done in our office with BMP prior

## 2024-05-01 ENCOUNTER — Ambulatory Visit: Payer: Self-pay

## 2024-05-01 LAB — BASIC METABOLIC PANEL WITH GFR
BUN/Creatinine Ratio: 21 (ref 10–24)
BUN: 30 mg/dL — ABNORMAL HIGH (ref 8–27)
CO2: 23 mmol/L (ref 20–29)
Calcium: 9.3 mg/dL (ref 8.6–10.2)
Chloride: 105 mmol/L (ref 96–106)
Creatinine, Ser: 1.43 mg/dL — ABNORMAL HIGH (ref 0.76–1.27)
Glucose: 132 mg/dL — ABNORMAL HIGH (ref 70–99)
Potassium: 3.9 mmol/L (ref 3.5–5.2)
Sodium: 143 mmol/L (ref 134–144)
eGFR: 55 mL/min/1.73 — ABNORMAL LOW (ref >59)

## 2024-05-10 ENCOUNTER — Encounter: Payer: Self-pay | Admitting: Cardiovascular Disease

## 2024-05-16 ENCOUNTER — Ambulatory Visit (HOSPITAL_COMMUNITY)
Admission: RE | Admit: 2024-05-16 | Discharge: 2024-05-16 | Disposition: A | Discharge status: Home / Self Care | Source: Ambulatory Visit | Attending: Cardiovascular Disease | Admitting: Cardiovascular Disease

## 2024-05-16 DIAGNOSIS — I77810 Thoracic aortic ectasia: Secondary | ICD-10-CM | POA: Diagnosis present

## 2024-05-16 MED ORDER — IOHEXOL 350 MG/ML SOLN
75.0000 mL | Freq: Once | INTRAVENOUS | Status: AC | PRN
  Administered 2024-05-16: 75 mL via INTRAVENOUS

## 2024-05-24 ENCOUNTER — Ambulatory Visit: Admitting: Cardiovascular Disease
